# Patient Record
Sex: Female | Born: 1940 | Race: White | Hispanic: No | Marital: Married | Smoking: Never smoker
Health system: Southern US, Community
[De-identification: ages and names within clinical notes are randomized; demographics above are authoritative.]

## PROBLEM LIST (undated history)

## (undated) DIAGNOSIS — E119 Type 2 diabetes mellitus without complications: Secondary | ICD-10-CM

## (undated) DIAGNOSIS — R350 Frequency of micturition: Secondary | ICD-10-CM

## (undated) DIAGNOSIS — I10 Essential (primary) hypertension: Secondary | ICD-10-CM

## (undated) DIAGNOSIS — N952 Postmenopausal atrophic vaginitis: Secondary | ICD-10-CM

## (undated) DIAGNOSIS — Z8719 Personal history of other diseases of the digestive system: Secondary | ICD-10-CM

## (undated) DIAGNOSIS — E7800 Pure hypercholesterolemia, unspecified: Secondary | ICD-10-CM

## (undated) DIAGNOSIS — N393 Stress incontinence (female) (male): Principal | ICD-10-CM

## (undated) DIAGNOSIS — IMO0002 Reserved for concepts with insufficient information to code with codable children: Secondary | ICD-10-CM

## (undated) HISTORY — DX: Essential (primary) hypertension: I10

## (undated) HISTORY — DX: Stress incontinence (female) (male): N39.3

## (undated) HISTORY — DX: Pure hypercholesterolemia, unspecified: E78.00

## (undated) HISTORY — DX: Reserved for concepts with insufficient information to code with codable children: IMO0002

## (undated) HISTORY — DX: Frequency of micturition: R35.0

## (undated) HISTORY — DX: Type 2 diabetes mellitus without complications: E11.9

## (undated) HISTORY — PX: CARPAL TUNNEL RELEASE: SHX101

## (undated) HISTORY — PX: OOPHORECTOMY: SHX86

## (undated) HISTORY — DX: Postmenopausal atrophic vaginitis: N95.2

## (undated) HISTORY — PX: ABDOMINAL HYSTERECTOMY: SHX81

---

## 2006-11-16 ENCOUNTER — Ambulatory Visit: Payer: Self-pay | Admitting: Internal Medicine

## 2007-08-11 ENCOUNTER — Ambulatory Visit: Payer: Self-pay | Admitting: Internal Medicine

## 2007-11-18 ENCOUNTER — Ambulatory Visit: Payer: Self-pay | Admitting: Internal Medicine

## 2007-11-29 ENCOUNTER — Ambulatory Visit: Payer: Self-pay | Admitting: Internal Medicine

## 2008-05-28 ENCOUNTER — Ambulatory Visit: Payer: Self-pay | Admitting: Internal Medicine

## 2008-06-27 ENCOUNTER — Ambulatory Visit: Payer: Self-pay | Admitting: Internal Medicine

## 2008-12-24 ENCOUNTER — Ambulatory Visit: Payer: Self-pay | Admitting: Internal Medicine

## 2009-12-25 ENCOUNTER — Ambulatory Visit: Payer: Self-pay | Admitting: Internal Medicine

## 2010-12-29 ENCOUNTER — Ambulatory Visit: Payer: Self-pay | Admitting: Internal Medicine

## 2011-12-30 ENCOUNTER — Ambulatory Visit: Payer: Self-pay | Admitting: Internal Medicine

## 2012-12-15 ENCOUNTER — Emergency Department: Payer: Self-pay | Admitting: Emergency Medicine

## 2012-12-15 LAB — CBC
HCT: 35.3 % (ref 35.0–47.0)
HGB: 12.1 g/dL (ref 12.0–16.0)
MCH: 31.1 pg (ref 26.0–34.0)
RBC: 3.89 10*6/uL (ref 3.80–5.20)
WBC: 9.9 10*3/uL (ref 3.6–11.0)

## 2012-12-15 LAB — COMPREHENSIVE METABOLIC PANEL
Alkaline Phosphatase: 90 U/L (ref 50–136)
BUN: 30 mg/dL — ABNORMAL HIGH (ref 7–18)
Calcium, Total: 9.4 mg/dL (ref 8.5–10.1)
Chloride: 103 mmol/L (ref 98–107)
Co2: 27 mmol/L (ref 21–32)
EGFR (Non-African Amer.): 50 — ABNORMAL LOW
Glucose: 142 mg/dL — ABNORMAL HIGH (ref 65–99)
Potassium: 3.1 mmol/L — ABNORMAL LOW (ref 3.5–5.1)
SGOT(AST): 28 U/L (ref 15–37)
SGPT (ALT): 30 U/L (ref 12–78)

## 2012-12-15 LAB — URINALYSIS, COMPLETE
Bilirubin,UR: NEGATIVE
Blood: NEGATIVE
Nitrite: NEGATIVE
Protein: NEGATIVE
Specific Gravity: 1.008 (ref 1.003–1.030)
Squamous Epithelial: <1
WBC UR: 2 /HPF (ref 0–5)

## 2012-12-16 LAB — URINE CULTURE

## 2012-12-30 ENCOUNTER — Ambulatory Visit: Payer: Self-pay | Admitting: Internal Medicine

## 2013-10-01 ENCOUNTER — Emergency Department: Payer: Self-pay | Admitting: Emergency Medicine

## 2013-10-01 LAB — URINALYSIS, COMPLETE
Bacteria: NONE SEEN
Blood: NEGATIVE
Hyaline Cast: 7
Ketone: NEGATIVE
Leukocyte Esterase: NEGATIVE
Nitrite: NEGATIVE
Ph: 5 (ref 4.5–8.0)
Protein: NEGATIVE
RBC,UR: <1 /HPF (ref 0–5)
Squamous Epithelial: NONE SEEN

## 2013-10-01 LAB — CBC WITH DIFFERENTIAL/PLATELET
Basophil #: 0.1 10*3/uL (ref 0.0–0.1)
Basophil %: 1.1 %
Eosinophil #: 1.5 10*3/uL — ABNORMAL HIGH (ref 0.0–0.7)
Eosinophil %: 12.3 %
HCT: 33.4 % — ABNORMAL LOW (ref 35.0–47.0)
HGB: 10.7 g/dL — ABNORMAL LOW (ref 12.0–16.0)
MCV: 90 fL (ref 80–100)
Neutrophil #: 6 10*3/uL (ref 1.4–6.5)
Platelet: 464 10*3/uL — ABNORMAL HIGH (ref 150–440)
RBC: 3.73 10*6/uL — ABNORMAL LOW (ref 3.80–5.20)
WBC: 12.1 10*3/uL — ABNORMAL HIGH (ref 3.6–11.0)

## 2013-10-01 LAB — COMPREHENSIVE METABOLIC PANEL
Albumin: 3.3 g/dL — ABNORMAL LOW (ref 3.4–5.0)
Bilirubin,Total: 0.2 mg/dL (ref 0.2–1.0)
Chloride: 107 mmol/L (ref 98–107)
Co2: 26 mmol/L (ref 21–32)
Creatinine: 1.16 mg/dL (ref 0.60–1.30)
EGFR (Non-African Amer.): 48 — ABNORMAL LOW
Glucose: 153 mg/dL — ABNORMAL HIGH (ref 65–99)
Osmolality: 285 (ref 275–301)
Potassium: 3.5 mmol/L (ref 3.5–5.1)
SGOT(AST): 19 U/L (ref 15–37)
SGPT (ALT): 18 U/L (ref 12–78)

## 2013-10-02 LAB — TROPONIN I: Troponin-I: <0.02 ng/mL

## 2014-01-02 ENCOUNTER — Ambulatory Visit: Payer: Self-pay | Admitting: Family Medicine

## 2014-02-13 ENCOUNTER — Ambulatory Visit: Payer: Self-pay | Admitting: Gastroenterology

## 2014-03-06 ENCOUNTER — Ambulatory Visit: Payer: Self-pay | Admitting: Gastroenterology

## 2015-01-04 ENCOUNTER — Ambulatory Visit: Payer: Self-pay | Admitting: Family Medicine

## 2015-12-13 ENCOUNTER — Other Ambulatory Visit: Payer: Self-pay | Admitting: Family Medicine

## 2015-12-13 DIAGNOSIS — Z1231 Encounter for screening mammogram for malignant neoplasm of breast: Secondary | ICD-10-CM

## 2016-01-06 ENCOUNTER — Other Ambulatory Visit: Payer: Self-pay | Admitting: Family Medicine

## 2016-01-06 ENCOUNTER — Ambulatory Visit
Admission: RE | Admit: 2016-01-06 | Discharge: 2016-01-06 | Disposition: A | Discharge status: Home / Self Care | Payer: Medicare Other | Source: Ambulatory Visit | Attending: Family Medicine | Admitting: Family Medicine

## 2016-01-06 DIAGNOSIS — Z1231 Encounter for screening mammogram for malignant neoplasm of breast: Secondary | ICD-10-CM

## 2016-04-16 ENCOUNTER — Encounter: Payer: Self-pay | Admitting: Obstetrics and Gynecology

## 2016-04-16 ENCOUNTER — Ambulatory Visit (INDEPENDENT_AMBULATORY_CARE_PROVIDER_SITE_OTHER): Payer: Medicare Other | Admitting: Obstetrics and Gynecology

## 2016-04-16 VITALS — BP 118/78 | HR 83 | Ht 65.0 in | Wt 156.6 lb

## 2016-04-16 DIAGNOSIS — N3289 Other specified disorders of bladder: Secondary | ICD-10-CM | POA: Diagnosis not present

## 2016-04-16 DIAGNOSIS — N8110 Cystocele, unspecified: Secondary | ICD-10-CM

## 2016-04-16 DIAGNOSIS — N952 Postmenopausal atrophic vaginitis: Secondary | ICD-10-CM

## 2016-04-16 DIAGNOSIS — N393 Stress incontinence (female) (male): Secondary | ICD-10-CM

## 2016-04-16 DIAGNOSIS — R350 Frequency of micturition: Secondary | ICD-10-CM | POA: Diagnosis not present

## 2016-04-16 DIAGNOSIS — IMO0002 Reserved for concepts with insufficient information to code with codable children: Secondary | ICD-10-CM | POA: Insufficient documentation

## 2016-04-16 HISTORY — DX: Reserved for concepts with insufficient information to code with codable children: IMO0002

## 2016-04-16 HISTORY — DX: Postmenopausal atrophic vaginitis: N95.2

## 2016-04-16 HISTORY — DX: Frequency of micturition: R35.0

## 2016-04-16 HISTORY — DX: Stress incontinence (female) (male): N39.3

## 2016-04-16 LAB — POCT URINALYSIS DIPSTICK
BILIRUBIN UA: NEGATIVE
Glucose, UA: NEGATIVE
Ketones, UA: NEGATIVE
NITRITE UA: NEGATIVE
PH UA: 6
PROTEIN UA: NEGATIVE
RBC UA: NEGATIVE
Spec Grav, UA: 1.01
Urobilinogen, UA: 0.2

## 2016-04-16 MED ORDER — ESTRADIOL 0.1 MG/GM VA CREA: 0.5000 | TOPICAL_CREAM | VAGINAL | Status: DC

## 2016-04-16 NOTE — Progress Notes (Signed)
GYN ENCOUNTER NOTE  Subjective:       Kara Sullivan is a 75 y.o. [id number] female is here for gynecologic evaluation of the following issues:  1. Bladder dysfunction- s/p hysterectomy >30years ago; 3 years ago she had a trial of several types of pessaries for her bladder drop, urgency, leakage, and frequency without success; must wear a pad at all times; has leakage with valsalva; states that her bladder has descended further over the past 3 years and wishes to have it addressed; denies constipation and nocturia    Gynecologic History No LMP recorded. Patient has had a hysterectomy.  Obstetric History OB History  Gravida Para Term Preterm AB SAB TAB Ectopic Multiple Living  2 2 2       2     # Outcome Date GA Lbr Len/2nd Weight Sex Delivery Anes PTL Lv  2 Term 1970   7 lb 3.2 oz (3.266 kg) F Vag-Spont   Y  1 Term 1965   8 lb 1.9 oz (3.683 kg) F Vag-Spont   Y      Past Medical History  Diagnosis Date  . Hypertension   . High cholesterol   . Diabetes mellitus without complication [facility](HCC)     Past Surgical History  Procedure Laterality Date  . Abdominal hysterectomy    . Oophorectomy    . Carpal tunnel release      No current outpatient prescriptions on file prior to visit.   No current facility-administered medications on file prior to visit.    Allergies  Allergen Reactions  . Ciprofloxacin Swelling  . Rosuvastatin Other (See Comments)    Elevated liver enzymes  . Sulfa Antibiotics Nausea And Vomiting    Social History   Social History  . Marital Status: Married    Spouse Name: N/A  . Number of Children: N/A  . Years of Education: N/A   Occupational History  . Not on file.   Social History Main Topics  . Smoking status: Never Smoker   . Smokeless tobacco: Not on file  . Alcohol Use: No  . Drug Use: No  . Sexual Activity: No   Other Topics Concern  . Not on file   Social History Narrative    Family History  Problem Relation Age of Onset  . Breast  cancer Maternal Aunt 768  . Heart attack Father   . Diabetes Neg Hx   . Colon cancer Neg Hx   . Ovarian cancer Neg Hx     The following portions of the patient's history were reviewed and updated as appropriate: allergies, current medications, past family history, past medical history, past social history, past surgical history and problem list.  Review of Systems Review of Systems - General ROS: negative for - chills, fatigue, fever, hot flashes, malaise or night sweats Hematological and Lymphatic ROS: negative for - bleeding problems or swollen lymph nodes Gastrointestinal ROS: negative for - abdominal pain, blood in stools, change in bowel habits and nausea/vomiting, negative for constipation Musculoskeletal ROS: negative for - joint pain, muscle pain or muscular weakness Genito-Urinary ROS: Positive for- urgency, increased frequency; negative for - change in menstrual cycle, dysmenorrhea, dyspareunia, dysuria, genital discharge, genital ulcers, hematuria, irregular/heavy menses, nocturia; incomplete emptying or pelvic pain  Objective:   BP 118/78 mmHg  Pulse 83  Ht 5\' 5"  (1.651 m)  Wt 156 lb 9.6 oz (71.033 kg)  BMI 26.06 kg/m2 CONSTITUTIONAL: Well-developed, well-nourished female in no acute distress.  HENT:  Normocephalic, atraumatic.  NECK: Normal  range of motion, supple, no masses.  Normal thyroid.  SKIN: Skin is warm and dry. No rash noted. Not diaphoretic. No erythema. No pallor. NEUROLGIC: Alert and oriented to person, place, and time. PSYCHIATRIC: Normal mood and affect. Normal behavior. Normal judgment and thought content. CARDIOVASCULAR:Not Examined RESPIRATORY: Not Examined BREASTS: Not Examined ABDOMEN: Soft, non distended; Non tender.  No Organomegaly. PELVIC:  External Genitalia: Normal  BUS: Normal  Vagina: Mild atrophy; second third degree cystocele with Valsalva loss of the urethrovesical junction angle; mild rectocele  Cervix: Surgically absent  Uterus:  Surgically absent  Adnexa: Normal, nontender, nonpalpable  RV: Normal sphincter tone, mild 1st degree rectocele  Bladder: Nontender, 3rd degree cystocele present MUSCULOSKELETAL: Normal range of motion. No tenderness.  No cyanosis, clubbing, or edema.     Assessment:   1. Frequent urination - POCT urinalysis dipstick - Urine culture 2. Cystocele 3. Mild rectocele not requiring repair  4. Stress urinary incontinence 5. Unstable bladder   Plan:   1. Anterior colporrhaphy With Kelly plication in 6 weeks 2. Return to clinic in 5 weeks for pre-op appointment 3. Patient understands unstable bladder symptoms may require medication for management 4. Patient understands that stress incontinence may be suboptimally treated in the future definitive surgery with sling may be warranted. 5. Estrace cream intravaginal twice a week until surgery  Mable FillPaige Owczarczak, PA-S Herold HarmsMartin A Amorina Doerr, MD   I have seen, interviewed, and examined the patient in conjunction with the [employer] and affirm the diagnosis and management plan. Arihanna Estabrook A. Ashlyn Cabler, MD, FACOG   Note: This dictation was prepared with Dragon dictation along with smaller phrase technology. Any transcriptional errors that result from this process are unintentional.

## 2016-04-16 NOTE — Patient Instructions (Signed)
1. Estrace cream 1 g intravaginal twice a week 2. Surgery for bladder prolapse is scheduled in 6 weeks 3. Return in 5 weeks for preop appointment

## 2016-04-17 LAB — URINE CULTURE

## 2016-04-30 ENCOUNTER — Telehealth: Payer: Self-pay | Admitting: Obstetrics and Gynecology

## 2016-04-30 MED ORDER — ESTROGENS, CONJUGATED 0.625 MG/GM VA CREA: 0.5000 | TOPICAL_CREAM | VAGINAL | Status: DC

## 2016-04-30 NOTE — Telephone Encounter (Signed)
Pt is having a bladder surgery, she wants to know her restrictions because she is going to move.

## 2016-04-30 NOTE — Telephone Encounter (Signed)
Pt may have to move surgery date d/t her moving. Advised pt when she [facility] calls to set up surgery date she may change it at that time. Pts estrace cream was over 200.00 dollars. Sent in premarin. Pt will call me back if too expensive. May have to give another sample.

## 2016-06-02 ENCOUNTER — Telehealth: Payer: Self-pay | Admitting: Obstetrics and Gynecology

## 2016-06-02 NOTE — Telephone Encounter (Signed)
Pt aware samples left up front for p/u.  

## 2016-06-02 NOTE — Telephone Encounter (Signed)
Pt said you told her you could give her some samples of ESTRACE. Can she come pick up sample?

## 2016-07-31 ENCOUNTER — Telehealth: Payer: Self-pay | Admitting: Obstetrics and Gynecology

## 2016-07-31 NOTE — Telephone Encounter (Signed)
Pt states she is living with family at this time. She will call back to schedule surgery once she is in her home. Aware to continue to use estrace cream twice weekly.

## 2016-07-31 NOTE — Telephone Encounter (Signed)
Pt called and she said she had a miss call from here and sh sees dr Tommi Rumpsde and wasn't sure if it was you that had called her or not.

## 2016-10-12 HISTORY — PX: BLADDER SUSPENSION: SHX72

## 2016-11-20 ENCOUNTER — Other Ambulatory Visit: Payer: Self-pay | Admitting: Family Medicine

## 2016-11-20 DIAGNOSIS — Z1231 Encounter for screening mammogram for malignant neoplasm of breast: Secondary | ICD-10-CM

## 2017-01-06 ENCOUNTER — Ambulatory Visit
Admission: RE | Admit: 2017-01-06 | Discharge: 2017-01-06 | Disposition: A | Discharge status: Home / Self Care | Payer: Medicare Other | Source: Ambulatory Visit | Attending: Family Medicine | Admitting: Family Medicine

## 2017-01-06 DIAGNOSIS — Z1231 Encounter for screening mammogram for malignant neoplasm of breast: Secondary | ICD-10-CM | POA: Diagnosis not present

## 2017-02-26 ENCOUNTER — Telehealth: Payer: Self-pay | Admitting: Certified Nurse Midwife

## 2017-02-26 MED ORDER — ESTROGENS, CONJUGATED 0.625 MG/GM VA CREA: 0.5000 | TOPICAL_CREAM | VAGINAL | 12 refills | Status: DC

## 2017-02-26 NOTE — Telephone Encounter (Signed)
Patient Kara Sullivan stating (Crystal Called patient to pick up "a cream" the patient needs). Please advise.

## 2017-02-26 NOTE — Telephone Encounter (Signed)
Pt has not been seen since 04/2016. Advised rx for premarin will be resent to [facility]. Pt will check with pharmacy first.

## 2017-02-27 ENCOUNTER — Other Ambulatory Visit: Payer: Self-pay | Admitting: Obstetrics and Gynecology

## 2017-03-01 ENCOUNTER — Telehealth: Payer: Self-pay | Admitting: Obstetrics and Gynecology

## 2017-03-01 NOTE — Telephone Encounter (Signed)
Pt aware premarin is 135.00 for 7 months of use (0.5 gm twice weekly).There is no generic.

## 2017-03-01 NOTE — Telephone Encounter (Signed)
Patient spoke with phamacy and the Premarin cream is 145.00 to fill  They told her she can get a generic for 45.00 - can that be changed with the pharmacy and do we have any samples available  Please call

## 2017-03-15 ENCOUNTER — Telehealth: Payer: Self-pay | Admitting: Obstetrics and Gynecology

## 2017-03-15 NOTE — Telephone Encounter (Signed)
Please call concerning medication - cream

## 2017-03-15 NOTE — Telephone Encounter (Signed)
Pt states that she has been on premarin for 1 week and has developed joint pain. She questioned if it was a s/e of premarin. Advised her to stop premarin for 2 weeks. Monitor sx. F/u at appt on 6/19.

## 2017-03-30 ENCOUNTER — Encounter: Payer: Self-pay | Admitting: Obstetrics and Gynecology

## 2017-03-30 ENCOUNTER — Ambulatory Visit (INDEPENDENT_AMBULATORY_CARE_PROVIDER_SITE_OTHER): Payer: Medicare Other | Admitting: Obstetrics and Gynecology

## 2017-03-30 VITALS — BP 155/67 | HR 88 | Ht 65.0 in | Wt 165.0 lb

## 2017-03-30 DIAGNOSIS — Z01818 Encounter for other preprocedural examination: Secondary | ICD-10-CM

## 2017-03-30 DIAGNOSIS — N393 Stress incontinence (female) (male): Secondary | ICD-10-CM

## 2017-03-30 DIAGNOSIS — N8111 Cystocele, midline: Secondary | ICD-10-CM | POA: Insufficient documentation

## 2017-03-30 DIAGNOSIS — N3289 Other specified disorders of bladder: Secondary | ICD-10-CM

## 2017-03-30 NOTE — H&P (Signed)
Subjective: PREOPERATIVE HISTORY AND PHYSICAL      Date of surgery: 04/19/2017 Diagnosis: 1. 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 Stress urinary incontinence Procedure: Anterior colporrhaphy with Kelly plication   Kara Sullivan is a 76 y.o. [id number] scheduled for anterior colporrhaphy with Kelly plication for symptomatic second to third-degree cystocele. 1. Bladder dysfunction- s/p hysterectomy >30years ago; 3 years ago she had a trial of several types of pessaries for her bladder drop, urgency, leakage, and frequency without success; must wear a pad at all times; has leakage with valsalva; states that her bladder has descended further over the past 3 years and wishes to have it addressed; denies constipation and nocturia .   Gynecologic History No LMP recorded. Kara Sullivan has had a hysterectomy.    OB History    Gravida Para Term Preterm AB Living   2 2 2     2   SAB TAB Ectopic Multiple Live Births           2     No LMP recorded. Kara Sullivan has had a hysterectomy.    Past Medical History:  Diagnosis Date  . Diabetes mellitus without complication (HCC)   . High cholesterol   . Hypertension     Past Surgical History:  Procedure Laterality Date  . ABDOMINAL HYSTERECTOMY    . CARPAL TUNNEL RELEASE    . OOPHORECTOMY      OB History  Gravida Para Term Preterm AB Living  2 2 2     2  SAB TAB Ectopic Multiple Live Births          2    # Outcome Date GA Lbr Len/2nd Weight Sex Delivery Anes PTL Lv  2 Term 1970   7 lb 3.2 oz (3.266 kg) F Vag-Spont   LIV  1 Term 1965   8 lb 1.9 oz (3.683 kg) F Vag-Spont   LIV      Social History   Social History  . Marital status: Married    Spouse name: N/A  . Number of children: N/A  . Years of education: N/A   Social History Main Topics  . Smoking status: Never Smoker  . Smokeless tobacco: Never Used  . Alcohol use No  . Drug use: No  . Sexual activity: No   Other Topics Concern  . None   Social History Narrative  . None     Family History  Problem Relation Age of Onset  . Breast cancer Maternal Aunt 68  . Heart attack Father   . Diabetes Neg Hx   . Colon cancer Neg Hx   . Ovarian cancer Neg Hx      (Not in a hospital admission)  Allergies  Allergen Reactions  . Atorvastatin Other (See Comments)  . Ciprofloxacin Swelling  . Rosuvastatin Other (See Comments)    Elevated liver enzymes  . Sulfa Antibiotics Nausea And Vomiting    Review of Systems Constitutional: No recent fever/chills/sweats Respiratory: No recent cough/bronchitis Cardiovascular: No chest pain Gastrointestinal: No recent nausea/vomiting/diarrhea Genitourinary: No UTI symptoms Hematologic/lymphatic:No history of coagulopathy or recent blood thinner use    Objective:    BP (!) 155/67   Pulse 88   Ht 5' 5" (1.651 m)   Wt 165 lb (74.8 kg)   BMI 27.46 kg/m   General:   Normal  Skin:   normal  HEENT:  Normal  Neck:  Supple without Adenopathy or Thyromegaly  Lungs:   Heart:                Breasts:   Abdomen:  Pelvis:  M/S   Extremeties:  Neuro:    clear to auscultation bilaterally   Normal without murmur   Not Examined   soft, non-tender; bowel sounds normal; no masses,  no organomegaly    No CVAT  Warm/Dry   Normal       03/30/2017 pelvic exam:  External genitalia-normal  BUS-urethral caruncle  Vagina-third-degree cystocele; no significant rectocele,: Possible enterocele  Cervix-surgically absent  Uterus-surgically absent  Rectovaginal-deferred to the OR  Assessment:     1. 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 Stress urinary incontinence   Plan:  Anterior colporrhaphy with Kelly plication  Preop counseling: The Kara Sullivan is to undergo anterior colporrhaphy with Kelly plication on 04/19/2017 for symptomatic second to third-degree cystocele. She is understanding of the planned procedure and is aware of and accepting of all surgical risks which include but are not limited to bleeding,  infection, pelvic organ injury with need for repair, blood clot disorders, anesthesia risks, etc. All questions have been answered. Informed consent is given. Kara Sullivan is ready and willing to proceed with surgery as scheduled.  Taiki Buckwalter A Brittnae Aschenbrenner, MD  Note: This dictation was prepared with Dragon dictation along with smaller phrase technology. Any transcriptional errors that result from this process are unintentional.    

## 2017-03-30 NOTE — Progress Notes (Signed)
Subjective: PREOPERATIVE HISTORY AND PHYSICAL      Date of surgery: 04/19/2017 Diagnosis: 1. 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 Stress urinary incontinence Procedure: Anterior colporrhaphy with Kelly plication   Patient is a 76 y.o. [id number] scheduled for anterior colporrhaphy with Kelly plication for symptomatic second to third-degree cystocele. 1. Bladder dysfunction- s/p hysterectomy >30years ago; 3 years ago she had a trial of several types of pessaries for her bladder drop, urgency, leakage, and frequency without success; must wear a pad at all times; has leakage with valsalva; states that her bladder has descended further over the past 3 years and wishes to have it addressed; denies constipation and nocturia .   Gynecologic History No LMP recorded. Patient has had a hysterectomy.    OB History    Gravida Para Term Preterm AB Living   2 2 2     2    SAB TAB Ectopic Multiple Live Births           2     No LMP recorded. Patient has had a hysterectomy.    Past Medical History:  Diagnosis Date  . Diabetes mellitus without complication (HCC)   . High cholesterol   . Hypertension     Past Surgical History:  Procedure Laterality Date  . ABDOMINAL HYSTERECTOMY    . CARPAL TUNNEL RELEASE    . OOPHORECTOMY      OB History  Gravida Para Term Preterm AB Living  2 2 2     2   SAB TAB Ectopic Multiple Live Births          2    # Outcome Date GA Lbr Len/2nd Weight Sex Delivery Anes PTL Lv  2 Term 1970   7 lb 3.2 oz (3.266 kg) F Vag-Spont   LIV  1 Term 1965   8 lb 1.9 oz (3.683 kg) F Vag-Spont   LIV      Social History   Social History  . Marital status: Married    Spouse name: N/A  . Number of children: N/A  . Years of education: N/A   Social History Main Topics  . Smoking status: Never Smoker  . Smokeless tobacco: Never Used  . Alcohol use No  . Drug use: No  . Sexual activity: No   Other Topics Concern  . None   Social History Narrative  . None     Family History  Problem Relation Age of Onset  . Breast cancer Maternal Aunt [id number]  . Heart attack Father   . Diabetes Neg Hx   . Colon cancer Neg Hx   . Ovarian cancer Neg Hx      (Not in a hospital admission)  Allergies  Allergen Reactions  . Atorvastatin Other (See Comments)  . Ciprofloxacin Swelling  . Rosuvastatin Other (See Comments)    Elevated liver enzymes  . Sulfa Antibiotics Nausea And Vomiting    Review of Systems Constitutional: No recent fever/chills/sweats Respiratory: No recent cough/bronchitis Cardiovascular: No chest pain Gastrointestinal: No recent nausea/vomiting/diarrhea Genitourinary: No UTI symptoms Hematologic/lymphatic:No history of coagulopathy or recent blood thinner use    Objective:    BP (!) 155/67   Pulse 88   Ht 5\' 5"  (1.651 m)   Wt 165 lb (74.8 kg)   BMI 27.46 kg/m   General:   Normal  Skin:   normal  HEENT:  Normal  Neck:  Supple without Adenopathy or Thyromegaly  Lungs:   Heart:  Breasts:   Abdomen:  Pelvis:  M/S   Extremeties:  Neuro:    clear to auscultation bilaterally   Normal without murmur   Not Examined   soft, non-tender; bowel sounds normal; no masses,  no organomegaly    No CVAT  Warm/Dry   Normal       03/30/2017 pelvic exam:  External genitalia-normal  BUS-urethral caruncle  Vagina-third-degree cystocele; no significant rectocele,: Possible enterocele  Cervix-surgically absent  Uterus-surgically absent  Rectovaginal-deferred to the OR  Assessment:     1. 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 Stress urinary incontinence   Plan:  Anterior colporrhaphy with Kelly plication  Preop counseling: The patient is to undergo anterior colporrhaphy with Kelly plication on 04/19/2017 for symptomatic second to third-degree cystocele. She is understanding of the planned procedure and is aware of and accepting of all surgical risks which include but are not limited to bleeding,  infection, pelvic organ injury with need for repair, blood clot disorders, anesthesia risks, etc. All questions have been answered. Informed consent is given. Patient is ready and willing to proceed with surgery as scheduled.  Herold Harms, MD  Note: This dictation was prepared with Dragon dictation along with smaller phrase technology. Any transcriptional errors that result from this process are unintentional.

## 2017-03-30 NOTE — Patient Instructions (Signed)
1.  Return for postop check a week after surgery 

## 2017-04-08 ENCOUNTER — Encounter
Admission: RE | Admit: 2017-04-08 | Discharge: 2017-04-08 | Disposition: A | Discharge status: Home / Self Care | Payer: Medicare Other | Source: Ambulatory Visit | Attending: Obstetrics and Gynecology | Admitting: Obstetrics and Gynecology

## 2017-04-08 DIAGNOSIS — E119 Type 2 diabetes mellitus without complications: Secondary | ICD-10-CM | POA: Insufficient documentation

## 2017-04-08 DIAGNOSIS — R9431 Abnormal electrocardiogram [ECG] [EKG]: Secondary | ICD-10-CM | POA: Diagnosis not present

## 2017-04-08 DIAGNOSIS — Z01812 Encounter for preprocedural laboratory examination: Secondary | ICD-10-CM | POA: Diagnosis present

## 2017-04-08 DIAGNOSIS — E7800 Pure hypercholesterolemia, unspecified: Secondary | ICD-10-CM | POA: Diagnosis not present

## 2017-04-08 DIAGNOSIS — Z01810 Encounter for preprocedural cardiovascular examination: Secondary | ICD-10-CM | POA: Diagnosis present

## 2017-04-08 DIAGNOSIS — I10 Essential (primary) hypertension: Secondary | ICD-10-CM | POA: Insufficient documentation

## 2017-04-08 LAB — TYPE AND SCREEN
ABO/RH(D): A POS
Antibody Screen: NEGATIVE

## 2017-04-08 LAB — RAPID HIV SCREEN (HIV 1/2 AB+AG)
HIV 1/2 Antibodies: NONREACTIVE
HIV-1 P24 Antigen - HIV24: NONREACTIVE

## 2017-04-08 NOTE — Patient Instructions (Signed)
Your procedure is scheduled on: Monday April 19, 2017 Report to Same Day Surgery on the 2nd floor in the [facility]. To find out your arrival time, please call [phone] between 1PM - 3PM on: Friday April 16, 2017   REMEMBER: Instructions that are not followed completely may result in serious medical risk up to and including death; or upon the discretion of your surgeon and anesthesiologist your surgery may need to be rescheduled.  Do not eat food or drink liquids after midnight. No gum chewing or hard candies  No Alcohol for 24 hours before or after surgery.  No Smoking for 24 hours prior to surgery.  Notify your doctor if there is any change in your medical condition (cold, fever, infection).  Do not wear jewelry, make-up, hairpins, clips or nail polish.  Do not wear lotions, powders, or perfumes.   Do not shave 48 hours prior to surgery. Men may shave face and neck.  Contacts and dentures may not be worn into surgery.  Do not bring valuables to the hospital. [facility] belongings or valuables.   TAKE THESE MEDICATIONS THE MORNING OF SURGERY WITH A SIP OF WATER:  NONE    Use CHG Soap or wipes as directed on instruction sheet.  Stop Metformin and Janumet 2 days prior to surgery. NONE ON July 7 AND 8.  Follow recommendations from Cardiologist, Pulmonologist or PCP regarding stopping Aspirin, Coumadin, Plavix, Eliquis, Pradaxa, or Pletal. STOP ASPIRIN April 12, 2017  Stop Anti-inflammatories such as Advil, Aleve, Ibuprofen, Motrin, Naproxen, Naprosyn, Goodie powder, or aspirin products. (May take Tylenol or Acetaminophen and Celebrex if needed.)  Stop supplements until after surgery. FISH OIL  (May continue Vitamin D, Vitamin B, and multivitamin.)  If you are being admitted to the hospital overnight, leave your suitcase in the car. After surgery it may be brought to your room.  If you are being discharged the day of surgery, you will not be  allowed to drive home. You will need someone to drive you home and stay with you that night.

## 2017-04-09 LAB — RPR: RPR Ser Ql: NONREACTIVE

## 2017-04-12 ENCOUNTER — Other Ambulatory Visit: Payer: Medicare Other

## 2017-04-19 ENCOUNTER — Encounter
Admission: RE | Disposition: A | Discharge status: Home / Self Care | Payer: Self-pay | Source: Ambulatory Visit | Attending: Obstetrics and Gynecology

## 2017-04-19 ENCOUNTER — Ambulatory Visit: Payer: Medicare Other | Admitting: Certified Registered Nurse Anesthetist

## 2017-04-19 ENCOUNTER — Observation Stay
Admission: RE | Admit: 2017-04-19 | Discharge: 2017-04-20 | Disposition: A | Discharge status: Home / Self Care | Payer: Medicare Other | Source: Ambulatory Visit | Attending: Obstetrics and Gynecology | Admitting: Obstetrics and Gynecology

## 2017-04-19 DIAGNOSIS — Z79899 Other long term (current) drug therapy: Secondary | ICD-10-CM | POA: Insufficient documentation

## 2017-04-19 DIAGNOSIS — E119 Type 2 diabetes mellitus without complications: Secondary | ICD-10-CM | POA: Insufficient documentation

## 2017-04-19 DIAGNOSIS — Z791 Long term (current) use of non-steroidal anti-inflammatories (NSAID): Secondary | ICD-10-CM | POA: Diagnosis not present

## 2017-04-19 DIAGNOSIS — Z9079 Acquired absence of other genital organ(s): Secondary | ICD-10-CM | POA: Insufficient documentation

## 2017-04-19 DIAGNOSIS — N3946 Mixed incontinence: Secondary | ICD-10-CM | POA: Diagnosis not present

## 2017-04-19 DIAGNOSIS — E7800 Pure hypercholesterolemia, unspecified: Secondary | ICD-10-CM | POA: Insufficient documentation

## 2017-04-19 DIAGNOSIS — Z7984 Long term (current) use of oral hypoglycemic drugs: Secondary | ICD-10-CM | POA: Insufficient documentation

## 2017-04-19 DIAGNOSIS — Z7982 Long term (current) use of aspirin: Secondary | ICD-10-CM | POA: Diagnosis not present

## 2017-04-19 DIAGNOSIS — N3289 Other specified disorders of bladder: Secondary | ICD-10-CM | POA: Insufficient documentation

## 2017-04-19 DIAGNOSIS — D649 Anemia, unspecified: Secondary | ICD-10-CM | POA: Diagnosis not present

## 2017-04-19 DIAGNOSIS — I10 Essential (primary) hypertension: Secondary | ICD-10-CM | POA: Insufficient documentation

## 2017-04-19 DIAGNOSIS — N815 Vaginal enterocele: Secondary | ICD-10-CM | POA: Insufficient documentation

## 2017-04-19 DIAGNOSIS — Z90710 Acquired absence of both cervix and uterus: Secondary | ICD-10-CM | POA: Insufficient documentation

## 2017-04-19 DIAGNOSIS — N8110 Cystocele, unspecified: Principal | ICD-10-CM | POA: Insufficient documentation

## 2017-04-19 DIAGNOSIS — N8111 Cystocele, midline: Secondary | ICD-10-CM | POA: Diagnosis present

## 2017-04-19 HISTORY — PX: CYSTOCELE REPAIR: SHX163

## 2017-04-19 LAB — POCT I-STAT 4, (NA,K, GLUC, HGB,HCT)
GLUCOSE: 140 mg/dL — AB (ref 65–99)
HCT: 34 % — ABNORMAL LOW (ref 36.0–46.0)
Hemoglobin: 11.6 g/dL — ABNORMAL LOW (ref 12.0–15.0)
POTASSIUM: 4.1 mmol/L (ref 3.5–5.1)
SODIUM: 140 mmol/L (ref 135–145)

## 2017-04-19 LAB — ABO/RH: ABO/RH(D): A POS

## 2017-04-19 LAB — GLUCOSE, CAPILLARY
GLUCOSE-CAPILLARY: 143 mg/dL — AB (ref 65–99)
Glucose-Capillary: 188 mg/dL — ABNORMAL HIGH (ref 65–99)

## 2017-04-19 SURGERY — COLPORRHAPHY, ANTERIOR, FOR CYSTOCELE REPAIR
Anesthesia: General | Site: Vagina | Wound class: Clean Contaminated

## 2017-04-19 MED ORDER — MORPHINE SULFATE (PF) 2 MG/ML IV SOLN
1.0000 mg | INTRAVENOUS | Status: DC | PRN
Start: 2017-04-19 — End: 2017-04-20
  Administered 2017-04-19 (×2): 2 mg via INTRAVENOUS
  Filled 2017-04-19 (×2): qty 1

## 2017-04-19 MED ORDER — KETOROLAC TROMETHAMINE 30 MG/ML IJ SOLN
15.0000 mg | Freq: Four times a day (QID) | INTRAMUSCULAR | Status: DC
  Administered 2017-04-19 – 2017-04-20 (×5): 15 mg via INTRAVENOUS
  Filled 2017-04-19: qty 1

## 2017-04-19 MED ORDER — PROPOFOL 10 MG/ML IV BOLUS
INTRAVENOUS | Status: AC
  Filled 2017-04-19: qty 20

## 2017-04-19 MED ORDER — SODIUM CHLORIDE 0.9 % IV SOLN
INTRAVENOUS | Status: DC
  Administered 2017-04-19: 06:00:00 via INTRAVENOUS

## 2017-04-19 MED ORDER — LIDOCAINE HCL (CARDIAC) 20 MG/ML IV SOLN
INTRAVENOUS | Status: DC | PRN
  Administered 2017-04-19: 60 mg via INTRAVENOUS

## 2017-04-19 MED ORDER — FENTANYL CITRATE (PF) 250 MCG/5ML IJ SOLN
INTRAMUSCULAR | Status: AC
  Filled 2017-04-19: qty 5

## 2017-04-19 MED ORDER — PHENYLEPHRINE HCL 10 MG/ML IJ SOLN
INTRAMUSCULAR | Status: DC | PRN
  Administered 2017-04-19: 100 ug via INTRAVENOUS

## 2017-04-19 MED ORDER — SEVOFLURANE IN SOLN
RESPIRATORY_TRACT | Status: AC
  Filled 2017-04-19: qty 250

## 2017-04-19 MED ORDER — ONDANSETRON HCL 4 MG/2ML IJ SOLN
INTRAMUSCULAR | Status: AC
  Filled 2017-04-19: qty 2

## 2017-04-19 MED ORDER — OXYCODONE-ACETAMINOPHEN 5-325 MG PO TABS
1.0000 | ORAL_TABLET | ORAL | Status: DC | PRN
  Administered 2017-04-19: 1 via ORAL
  Filled 2017-04-19: qty 1

## 2017-04-19 MED ORDER — FAMOTIDINE 20 MG PO TABS
20.0000 mg | ORAL_TABLET | Freq: Once | ORAL | Status: AC
  Administered 2017-04-19: 20 mg via ORAL

## 2017-04-19 MED ORDER — SUGAMMADEX SODIUM 200 MG/2ML IV SOLN
INTRAVENOUS | Status: AC
  Filled 2017-04-19: qty 2

## 2017-04-19 MED ORDER — PROPOFOL 10 MG/ML IV BOLUS
INTRAVENOUS | Status: DC | PRN
  Administered 2017-04-19: 120 mg via INTRAVENOUS
  Administered 2017-04-19: 30 mg via INTRAVENOUS
  Administered 2017-04-19: 20 mg via INTRAVENOUS

## 2017-04-19 MED ORDER — PHENYLEPHRINE HCL 10 MG/ML IJ SOLN
INTRAMUSCULAR | Status: AC
  Filled 2017-04-19: qty 1

## 2017-04-19 MED ORDER — DEXAMETHASONE SODIUM PHOSPHATE 10 MG/ML IJ SOLN
INTRAMUSCULAR | Status: DC | PRN
  Administered 2017-04-19: 5 mg via INTRAVENOUS

## 2017-04-19 MED ORDER — ONDANSETRON HCL 4 MG/2ML IJ SOLN
INTRAMUSCULAR | Status: DC | PRN
  Administered 2017-04-19: 4 mg via INTRAVENOUS

## 2017-04-19 MED ORDER — FENTANYL CITRATE (PF) 100 MCG/2ML IJ SOLN
INTRAMUSCULAR | Status: AC
Start: 2017-04-19 — End: 2017-04-19
  Administered 2017-04-19: 25 ug via INTRAVENOUS
  Filled 2017-04-19: qty 2

## 2017-04-19 MED ORDER — ESTROGENS, CONJUGATED 0.625 MG/GM VA CREA
TOPICAL_CREAM | VAGINAL | Status: DC | PRN
  Administered 2017-04-19: 1 via VAGINAL

## 2017-04-19 MED ORDER — KETOROLAC TROMETHAMINE 30 MG/ML IJ SOLN
15.0000 mg | Freq: Four times a day (QID) | INTRAMUSCULAR | Status: DC
  Filled 2017-04-19 (×4): qty 1

## 2017-04-19 MED ORDER — SUGAMMADEX SODIUM 200 MG/2ML IV SOLN
INTRAVENOUS | Status: DC | PRN
  Administered 2017-04-19: 200 mg via INTRAVENOUS

## 2017-04-19 MED ORDER — LIDOCAINE HCL (PF) 2 % IJ SOLN
INTRAMUSCULAR | Status: AC
  Filled 2017-04-19: qty 2

## 2017-04-19 MED ORDER — METFORMIN HCL ER 500 MG PO TB24
500.0000 mg | ORAL_TABLET | Freq: Every day | ORAL | Status: DC
  Administered 2017-04-19: 500 mg via ORAL
  Filled 2017-04-19 (×2): qty 1

## 2017-04-19 MED ORDER — DEXAMETHASONE SODIUM PHOSPHATE 10 MG/ML IJ SOLN
INTRAMUSCULAR | Status: AC
  Filled 2017-04-19: qty 1

## 2017-04-19 MED ORDER — ROCURONIUM BROMIDE 100 MG/10ML IV SOLN
INTRAVENOUS | Status: DC | PRN
  Administered 2017-04-19: 50 mg via INTRAVENOUS

## 2017-04-19 MED ORDER — LACTATED RINGERS IV SOLN
INTRAVENOUS | Status: DC
  Administered 2017-04-19 – 2017-04-20 (×3): via INTRAVENOUS

## 2017-04-19 MED ORDER — LACTATED RINGERS IV SOLN: INTRAVENOUS | Status: DC

## 2017-04-19 MED ORDER — FENTANYL CITRATE (PF) 100 MCG/2ML IJ SOLN
25.0000 ug | INTRAMUSCULAR | Status: DC | PRN
  Administered 2017-04-19 (×6): 25 ug via INTRAVENOUS

## 2017-04-19 MED ORDER — ONDANSETRON HCL 4 MG/2ML IJ SOLN: 4.0000 mg | Freq: Once | INTRAMUSCULAR | Status: DC | PRN

## 2017-04-19 MED ORDER — EPHEDRINE SULFATE 50 MG/ML IJ SOLN
INTRAMUSCULAR | Status: AC
Start: 2017-04-19 — End: 2017-04-19
  Filled 2017-04-19: qty 1

## 2017-04-19 MED ORDER — EPHEDRINE SULFATE 50 MG/ML IJ SOLN
INTRAMUSCULAR | Status: DC | PRN
  Administered 2017-04-19: 10 mg via INTRAVENOUS

## 2017-04-19 MED ORDER — ROCURONIUM BROMIDE 50 MG/5ML IV SOLN
INTRAVENOUS | Status: AC
  Filled 2017-04-19: qty 1

## 2017-04-19 MED ORDER — DOCUSATE SODIUM 100 MG PO CAPS
100.0000 mg | ORAL_CAPSULE | Freq: Two times a day (BID) | ORAL | Status: DC
  Administered 2017-04-20: 100 mg via ORAL
  Filled 2017-04-19: qty 1

## 2017-04-19 MED ORDER — FENTANYL CITRATE (PF) 100 MCG/2ML IJ SOLN
INTRAMUSCULAR | Status: DC | PRN
  Administered 2017-04-19: 25 ug via INTRAVENOUS
  Administered 2017-04-19 (×2): 50 ug via INTRAVENOUS
  Administered 2017-04-19 (×2): 25 ug via INTRAVENOUS

## 2017-04-19 MED ORDER — ESTROGENS, CONJUGATED 0.625 MG/GM VA CREA
TOPICAL_CREAM | VAGINAL | Status: AC
  Filled 2017-04-19: qty 30

## 2017-04-19 MED ORDER — LISINOPRIL 10 MG PO TABS
20.0000 mg | ORAL_TABLET | Freq: Every day | ORAL | Status: DC
  Administered 2017-04-20: 20 mg via ORAL
  Filled 2017-04-19: qty 2

## 2017-04-19 MED ORDER — HYDROCHLOROTHIAZIDE 12.5 MG PO CAPS
12.5000 mg | ORAL_CAPSULE | Freq: Every day | ORAL | Status: DC
  Administered 2017-04-20: 12.5 mg via ORAL
  Filled 2017-04-19 (×2): qty 1

## 2017-04-19 MED ORDER — FENTANYL CITRATE (PF) 100 MCG/2ML IJ SOLN
INTRAMUSCULAR | Status: AC
  Filled 2017-04-19: qty 2

## 2017-04-19 MED ORDER — FAMOTIDINE 20 MG PO TABS
ORAL_TABLET | ORAL | Status: AC
  Administered 2017-04-19: 20 mg via ORAL
  Filled 2017-04-19: qty 1

## 2017-04-19 MED ORDER — SIMETHICONE 80 MG PO CHEW: 80.0000 mg | CHEWABLE_TABLET | Freq: Four times a day (QID) | ORAL | Status: DC | PRN

## 2017-04-19 MED ORDER — ACETAMINOPHEN 325 MG PO TABS: 650.0000 mg | ORAL_TABLET | ORAL | Status: DC | PRN

## 2017-04-19 MED ORDER — LISINOPRIL-HYDROCHLOROTHIAZIDE 20-12.5 MG PO TABS: 1.0000 | ORAL_TABLET | Freq: Every day | ORAL | Status: DC

## 2017-04-19 SURGICAL SUPPLY — 27 items
BAG URO DRAIN 2000ML W/SPOUT (MISCELLANEOUS) ×2 IMPLANT
CANISTER SUCT 1200ML W/VALVE (MISCELLANEOUS) ×2 IMPLANT
CATH FOLEY 2WAY  5CC 16FR (CATHETERS) ×1
CATH URTH 16FR FL 2W BLN LF (CATHETERS) ×1 IMPLANT
DRAPE PERI LITHO V/GYN (MISCELLANEOUS) ×2 IMPLANT
DRAPE SHEET LG 3/4 BI-LAMINATE (DRAPES) ×2 IMPLANT
DRAPE UNDER BUTTOCK W/FLU (DRAPES) ×2 IMPLANT
ELECT REM PT RETURN 9FT ADLT (ELECTROSURGICAL) ×2
ELECTRODE REM PT RTRN 9FT ADLT (ELECTROSURGICAL) ×1 IMPLANT
GAUZE PACK 2X3YD (MISCELLANEOUS) ×2 IMPLANT
GLOVE BIO SURGEON STRL SZ8 (GLOVE) ×8 IMPLANT
GLOVE INDICATOR 8.0 STRL GRN (GLOVE) ×8 IMPLANT
GOWN STRL REUS W/ TWL LRG LVL3 (GOWN DISPOSABLE) ×2 IMPLANT
GOWN STRL REUS W/ TWL XL LVL3 (GOWN DISPOSABLE) ×2 IMPLANT
GOWN STRL REUS W/TWL LRG LVL3 (GOWN DISPOSABLE) ×2
GOWN STRL REUS W/TWL XL LVL3 (GOWN DISPOSABLE) ×2
KIT RM TURNOVER CYSTO AR (KITS) ×2 IMPLANT
LABEL OR SOLS (LABEL) ×2 IMPLANT
NS IRRIG 500ML POUR BTL (IV SOLUTION) IMPLANT
PACK BASIN MINOR ARMC (MISCELLANEOUS) ×2 IMPLANT
PAD PREP 24X41 OB/GYN DISP (PERSONAL CARE ITEMS) ×2 IMPLANT
SPONGE XRAY 4X4 16PLY STRL (MISCELLANEOUS) ×2 IMPLANT
SUT CHROMIC 2 0 CT 1 (SUTURE) ×10 IMPLANT
SUT VIC AB 0 CT1 36 (SUTURE) ×4 IMPLANT
SUT VIC AB 0 CT2 27 (SUTURE) ×6 IMPLANT
SUT VIC AB 2-0 UR6 27 (SUTURE) ×8 IMPLANT
SYRINGE 10CC LL (SYRINGE) ×2 IMPLANT

## 2017-04-19 NOTE — Anesthesia Procedure Notes (Signed)
Procedure Name: Intubation Date/Time: 04/19/2017 7:45 AM Performed by: Eben Burow Pre-anesthesia Checklist: Patient identified, Emergency Drugs available, Suction available, Timeout performed and Patient being monitored Patient Re-evaluated:Patient Re-evaluated prior to inductionOxygen Delivery Method: Circle system utilized Preoxygenation: Pre-oxygenation with 100% oxygen Intubation Type: IV induction Ventilation: Mask ventilation without difficulty and Oral airway inserted - appropriate to patient size Laryngoscope Size: Mac and 3 Grade View: Grade I Tube type: Oral Tube size: 7.0 mm Number of attempts: 1 Airway Equipment and Method: Stylet Placement Confirmation: ETT inserted through vocal cords under direct vision,  positive ETCO2 and breath sounds checked- equal and bilateral Secured at: 21 cm Tube secured with: Tape Dental Injury: Teeth and Oropharynx as per pre-operative assessment

## 2017-04-19 NOTE — Anesthesia Post-op Follow-up Note (Cosign Needed)
Anesthesia QCDR form completed.        

## 2017-04-19 NOTE — Op Note (Signed)
OPERATIVE NOTE:  Richelle ItoFrancis E Gist PROCEDURE DATE: 04/19/2017 with   PREOPERATIVE DIAGNOSIS:  1. 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 Urinary incontinence 3. Unstable bladder  POSTOPERATIVE DIAGNOSIS:  1. 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 Urinary incontinence 3. Unstable bladder 4. Vaginal enterocele  PROCEDURE:  1. Anterior colporrhaphy with Kelly plication 2. Enterocele ligation   SURGEON:  Herold HarmsMartin A Farin Buhman, MD ASSISTANTS: Surgical tech ANESTHESIA: General INDICATIONS: 76 y.o. [id number] with second third degree cystocele, symptomatic, with urinary incontinence, urination, pelvic pressure, and mixed incontinence symptoms. Symptoms are progressively worsening and she now desires surgical repair.  FINDINGS:  Second to third-degree cystocele with rotational descent of the bladder neck; vaginal enterocele   I/O's: Total I/O In: -  Out: 400 [Urine:300; Blood:100] COUNTS:  YES SPECIMENS: None ANTIBIOTIC PROPHYLAXIS:N/A COMPLICATIONS: None immediate  PROCEDURE IN DETAIL: Patient was brought to the operating room and placed in supine position. General endotracheal anesthesia was induced without difficulty. She was placed in the dorsal lithotomy position using the candycane stirrups. A ChloraPrep perineal intravaginal prep and drape was performed in standard fashion. Timeout was performed. Foley catheter was placed in the bladder and was draining clear yellow urine. Weighted speculum was placed in the vagina. Allis clamps were used to grasp the lateral margins of the vaginal mucosa at the base of the bladder. Transverse incision was made through the vaginal mucosa. Using Metzenbaum scissors, the vaginal mucosa was undermined and incised in the midline. Allis Adair retractors were used to help facilitate exposure. This procedure was carried out to within 2 cm of the urethral meatus. The perivesical fascia was dissected off of the vagina through sharp and  blunt dissection. Once adequately mobilized, an enterocele was identified. The enterocele was not opened. 2 pursestring sutures of 0 Vicryl were placed in order to reduce the enterocele posteriorly. 2-0 Vicryl suture was then used in a vertical mattress technique to place Kelly plication sutures, followed by reduction of the cystocele with vertical mattress sutures of 2-0 Vicryl. Once the cystocele was adequately reduced, the vaginal mucosa was trimmed, and then reapproximated using 2-0 chromic simple interrupted sutures. Vaginal packing was placed using Kerlix and Premarin cream. The patient was then awakened mobilized and taken to recovery room in satisfactory condition. The urine was noted to be clear at the end of the procedure.  Larine Fielding A. Beatris Sie Francesco, MD, ACOG [facility]

## 2017-04-19 NOTE — H&P (View-Only) (Signed)
Subjective: PREOPERATIVE HISTORY AND PHYSICAL      Date of surgery: 04/19/2017 Diagnosis: 1. 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 Stress urinary incontinence Procedure: Anterior colporrhaphy with Kelly plication   Patient is a 76 y.o. G2P2002female scheduled for anterior colporrhaphy with Kelly plication for symptomatic second to third-degree cystocele. 1. Bladder dysfunction- s/p hysterectomy >30years ago; 3 years ago she had a trial of several types of pessaries for her bladder drop, urgency, leakage, and frequency without success; must wear a pad at all times; has leakage with valsalva; states that her bladder has descended further over the past 3 years and wishes to have it addressed; denies constipation and nocturia .   Gynecologic History No LMP recorded. Patient has had a hysterectomy.    OB History    Gravida Para Term Preterm AB Living   2 2 2     2   SAB TAB Ectopic Multiple Live Births           2     No LMP recorded. Patient has had a hysterectomy.    Past Medical History:  Diagnosis Date  . Diabetes mellitus without complication (HCC)   . High cholesterol   . Hypertension     Past Surgical History:  Procedure Laterality Date  . ABDOMINAL HYSTERECTOMY    . CARPAL TUNNEL RELEASE    . OOPHORECTOMY      OB History  Gravida Para Term Preterm AB Living  2 2 2     2  SAB TAB Ectopic Multiple Live Births          2    # Outcome Date GA Lbr Len/2nd Weight Sex Delivery Anes PTL Lv  2 Term 1970   7 lb 3.2 oz (3.266 kg) F Vag-Spont   LIV  1 Term 1965   8 lb 1.9 oz (3.683 kg) F Vag-Spont   LIV      Social History   Social History  . Marital status: Married    Spouse name: N/A  . Number of children: N/A  . Years of education: N/A   Social History Main Topics  . Smoking status: Never Smoker  . Smokeless tobacco: Never Used  . Alcohol use No  . Drug use: No  . Sexual activity: No   Other Topics Concern  . None   Social History Narrative  . None     Family History  Problem Relation Age of Onset  . Breast cancer Maternal Aunt 68  . Heart attack Father   . Diabetes Neg Hx   . Colon cancer Neg Hx   . Ovarian cancer Neg Hx      (Not in a hospital admission)  Allergies  Allergen Reactions  . Atorvastatin Other (See Comments)  . Ciprofloxacin Swelling  . Rosuvastatin Other (See Comments)    Elevated liver enzymes  . Sulfa Antibiotics Nausea And Vomiting    Review of Systems Constitutional: No recent fever/chills/sweats Respiratory: No recent cough/bronchitis Cardiovascular: No chest pain Gastrointestinal: No recent nausea/vomiting/diarrhea Genitourinary: No UTI symptoms Hematologic/lymphatic:No history of coagulopathy or recent blood thinner use    Objective:    BP (!) 155/67   Pulse 88   Ht 5' 5" (1.651 m)   Wt 165 lb (74.8 kg)   BMI 27.46 kg/m   General:   Normal  Skin:   normal  HEENT:  Normal  Neck:  Supple without Adenopathy or Thyromegaly  Lungs:   Heart:                Breasts:   Abdomen:  Pelvis:  M/S   Extremeties:  Neuro:    clear to auscultation bilaterally   Normal without murmur   Not Examined   soft, non-tender; bowel sounds normal; no masses,  no organomegaly    No CVAT  Warm/Dry   Normal       03/30/2017 pelvic exam:  External genitalia-normal  BUS-urethral caruncle  Vagina-third-degree cystocele; no significant rectocele,: Possible enterocele  Cervix-surgically absent  Uterus-surgically absent  Rectovaginal-deferred to the OR  Assessment:     1. 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 Stress urinary incontinence   Plan:  Anterior colporrhaphy with Kelly plication  Preop counseling: The patient is to undergo anterior colporrhaphy with Kelly plication on 04/19/2017 for symptomatic second to third-degree cystocele. She is understanding of the planned procedure and is aware of and accepting of all surgical risks which include but are not limited to bleeding,  infection, pelvic organ injury with need for repair, blood clot disorders, anesthesia risks, etc. All questions have been answered. Informed consent is given. Patient is ready and willing to proceed with surgery as scheduled.  Shaheem Pichon A Florrie Ramires, MD  Note: This dictation was prepared with Dragon dictation along with smaller phrase technology. Any transcriptional errors that result from this process are unintentional.    

## 2017-04-19 NOTE — Interval H&P Note (Signed)
History and Physical Interval Note:  04/19/2017 7:25 AM  Kara Sullivan  has presented today for surgery, with the diagnosis of FREQUENT URINATION, CYSTOCELE, UNSTABLE BLADDER, VAGINAL ATROPHY, STRESS INCONTINENCE  The various methods of treatment have been discussed with the patient and family. After consideration of risks, benefits and other options for treatment, the patient has consented to  Procedure(s): ANTERIOR REPAIR (CYSTOCELE) (N/A) as a surgical intervention .  The patient's history has been reviewed, patient examined, no change in status, stable for surgery.  I have reviewed the patient's chart and labs.  Questions were answered to the patient's satisfaction.     Daphine DeutscherMartin A Altheria Shadoan

## 2017-04-19 NOTE — Anesthesia Preprocedure Evaluation (Signed)
Anesthesia Evaluation  Patient identified by MRN, date of birth, ID band Patient awake    Reviewed: Allergy & Precautions, NPO status , Patient's Chart, lab work & pertinent test results  History of Anesthesia Complications Negative for: history of anesthetic complications  Airway Mallampati: II       Dental   Pulmonary neg pulmonary ROS,           Cardiovascular hypertension, Pt. on medications      Neuro/Psych negative neurological ROS     GI/Hepatic negative GI ROS, Neg liver ROS,   Endo/Other  diabetes, Type 2, Oral Hypoglycemic Agents  Renal/GU      Musculoskeletal   Abdominal   Peds  Hematology negative hematology ROS (+)   Anesthesia Other Findings   Reproductive/Obstetrics                             Anesthesia Physical Anesthesia Plan  ASA: II  Anesthesia Plan: General   Post-op Pain Management:    Induction: Intravenous  PONV Risk Score and Plan: 4 or greater and Ondansetron, Dexamethasone, Propofol, Midazolam and Scopolamine patch - Pre-op  Airway Management Planned: Oral ETT  Additional Equipment:   Intra-op Plan:   Post-operative Plan:   Informed Consent: I have reviewed the patients History and Physical, chart, labs and discussed the procedure including the risks, benefits and alternatives for the proposed anesthesia with the patient or authorized representative who has indicated his/her understanding and acceptance.     Plan Discussed with:   Anesthesia Plan Comments:         Anesthesia Quick Evaluation

## 2017-04-19 NOTE — Anesthesia Postprocedure Evaluation (Signed)
Anesthesia Post Note  Patient: Kara Sullivan  Procedure(s) Performed: Procedure(s) (LRB): ANTERIOR COLPORRHAPY WITH KELLY PLICATION AND ENTEROCELE LIGATION (N/A)  Patient location during evaluation: PACU Anesthesia Type: General Level of consciousness: awake and alert Pain management: pain level controlled Vital Signs Assessment: post-procedure vital signs reviewed and stable Respiratory status: spontaneous breathing and respiratory function stable Cardiovascular status: stable Anesthetic complications: no     Last Vitals:  Vitals:   04/19/17 1005 04/19/17 1010  BP:    Pulse: 89 93  Resp: 18 19  Temp:      Last Pain:  Vitals:   04/19/17 1010  TempSrc:   PainSc: 6                  Anberlin Diez K

## 2017-04-19 NOTE — Transfer of Care (Signed)
Immediate Anesthesia Transfer of Care Note  Patient: Kara Sullivan  Procedure(s) Performed: Procedure(s): ANTERIOR COLPORRHAPY WITH KELLY PLICATION AND ENTEROCELE LIGATION (N/A)  Patient Location: PACU  Anesthesia Type:General  Level of Consciousness: drowsy  Airway & Oxygen Therapy: Patient Spontanous Breathing and Patient connected to face mask oxygen  Post-op Assessment: Report given to RN and Post -op Vital signs reviewed and stable  Post vital signs: Reviewed and stable  Last Vitals:  Vitals:   04/19/17 0600 04/19/17 0942  BP: (!) 167/72 (!) 125/54  Pulse: 84 91  Resp: 16 16  Temp: 36.6 C 36.7 C    Last Pain:  Vitals:   04/19/17 0942  TempSrc:   PainSc: Asleep         Complications: No apparent anesthesia complications

## 2017-04-20 DIAGNOSIS — N8110 Cystocele, unspecified: Secondary | ICD-10-CM | POA: Diagnosis not present

## 2017-04-20 LAB — HEMOGLOBIN: Hemoglobin: 9.4 g/dL — ABNORMAL LOW (ref 12.0–16.0)

## 2017-04-20 MED ORDER — DOCUSATE SODIUM 100 MG PO CAPS: 100.0000 mg | ORAL_CAPSULE | Freq: Two times a day (BID) | ORAL | 0 refills | Status: DC

## 2017-04-20 MED ORDER — OXYCODONE-ACETAMINOPHEN 5-325 MG PO TABS: 1.0000 | ORAL_TABLET | ORAL | 0 refills | Status: DC | PRN

## 2017-04-20 MED ORDER — FERROUS SULFATE 325 (65 FE) MG PO TABS: 325.0000 mg | ORAL_TABLET | Freq: Every day | ORAL | 0 refills | Status: DC

## 2017-04-20 NOTE — Progress Notes (Signed)
D/C instructions provided, pt states understanding, aware of follow up appt.  Prescription given to pt.   

## 2017-04-20 NOTE — Discharge Summary (Signed)
Physician Discharge Summary  Patient ID: Kara Sullivan MRN: [id number] DOB/AGE: 74/05/1941 74 y.o.  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 Miod Post Op anemia, asymptomatic   Significant Diagnostic Studies:  Lab Results  Component Value Date   HGB 9.4 (L) 04/20/2017   HGB 11.6 (L) 04/19/2017   HGB 10.7 (L) 10/01/2013   Lab Results  Component Value Date   HCT 34.0 (L) 04/19/2017   HCT 33.4 (L) 10/01/2013   HCT 35.3 12/15/2012   CBC Latest Ref Rng & Units 04/20/2017 04/19/2017 10/01/2013  WBC 3.6 - 11.0 x10 3/mm 3 - - 12.1(H)  Hemoglobin 12.0 - 16.0 g/dL 4.0(J9.4(L) 11.6(L) 10.7(L)  Hematocrit 36.0 - 46.0 % - 34.0(L) 33.4(L)  Platelets 150 - 440 x10 3/mm 3 - - 464(H)     Discharged Condition: good  Discharge Exam: Blood pressure 138/66, pulse 82, temperature 98.3 F (36.8 C), temperature source Oral, resp. rate 16, weight 165 lb (74.8 kg), SpO2 95 %. Incision/Wound: no drainage  Disposition: Final discharge disposition-home   Allergies as of 04/20/2017      Reactions   Atorvastatin Other (See Comments)   Ciprofloxacin Swelling   Rosuvastatin Other (See Comments)   Elevated liver enzymes   Sulfa Antibiotics Nausea And Vomiting      Medication List    STOP taking these medications   PREMARIN vaginal cream Generic drug:  conjugated estrogens     TAKE these medications   acetaminophen 500 MG tablet Commonly known as:  TYLENOL Take 1,000 mg by mouth every 8 (eight) hours as needed for mild pain.   aspirin EC 81 MG tablet Take 81 mg by mouth daily.   calcium-vitamin D 250-125 MG-UNIT tablet Commonly known as:  OSCAL WITH D Take 1 tablet by mouth daily.    docusate sodium 100 MG capsule Commonly known as:  COLACE Take 1 capsule (100 mg total) by mouth 2 (two) times daily.   ferrous sulfate 325 (65 FE) MG tablet Take 1 tablet (325 mg total) by mouth daily with breakfast.   Fish Oil 1000 MG Caps Take 1,000 mg by mouth daily.   lisinopril-hydrochlorothiazide 20-12.5 MG tablet Commonly known as:  PRINZIDE,ZESTORETIC Take 1 tablet by mouth daily.   meloxicam 15 MG tablet Commonly known as:  MOBIC Take 15 mg by mouth daily as needed for pain.   metFORMIN 500 MG 24 hr tablet Commonly known as:  GLUCOPHAGE-XR Take 500 mg by mouth daily with supper.   multivitamin with minerals Tabs tablet Take 1 tablet by mouth daily. For Women   oxyCODONE-acetaminophen 5-325 MG tablet Commonly known as:  PERCOCET/ROXICET Take 1-2 tablets by mouth every 4 (four) hours as needed (moderate to severe pain (when tolerating fluids)).   triamcinolone cream 0.1 % Commonly known as:  KENALOG Apply 1 application topically daily as needed (rash).   ZETIA 10 MG tablet Generic drug:  ezetimibe Take 10 mg by mouth daily.      Follow-up Information    Bryssa Tones, Prentice DockerMartin A, MD Follow up.   Specialties:  Obstetrics and Gynecology, Radiology Contact information: [address] [place] [address] [phone]           Signed: Prentice DockerMartin A Kara Sullivan 04/20/2017, 8:02 AM

## 2017-04-20 NOTE — Care Management Obs Status (Signed)
MEDICARE OBSERVATION STATUS NOTIFICATION   Patient Details  Name: Kara Sullivan MRN: [id number] Date of Birth: 10/12/1941   Medicare Observation Status Notification Given:  Yes    Gwenette GreetBrenda S Prather Failla, RN 04/20/2017, 10:58 AM

## 2017-04-27 ENCOUNTER — Encounter: Payer: Self-pay | Admitting: Obstetrics and Gynecology

## 2017-04-27 ENCOUNTER — Ambulatory Visit (INDEPENDENT_AMBULATORY_CARE_PROVIDER_SITE_OTHER): Payer: Medicare Other | Admitting: Obstetrics and Gynecology

## 2017-04-27 VITALS — BP 156/66 | HR 84 | Ht 65.0 in | Wt 160.2 lb

## 2017-04-27 DIAGNOSIS — N3289 Other specified disorders of bladder: Secondary | ICD-10-CM

## 2017-04-27 DIAGNOSIS — Z09 Encounter for follow-up examination after completed treatment for conditions other than malignant neoplasm: Secondary | ICD-10-CM

## 2017-04-27 DIAGNOSIS — N8111 Cystocele, midline: Secondary | ICD-10-CM

## 2017-04-27 DIAGNOSIS — N393 Stress incontinence (female) (male): Secondary | ICD-10-CM

## 2017-04-27 MED ORDER — MIRABEGRON ER 25 MG PO TB24: 25.0000 mg | ORAL_TABLET | Freq: Every day | ORAL | 3 refills | Status: DC

## 2017-04-27 NOTE — Progress Notes (Signed)
Chief complaint: 1. One week postop check 2. Status post anterior colporrhaphy with enterocele ligation 3. History of mixed incontinence  Patient presents for surgical  postop check approximately 8 days after surgery. She reports normal bowel function and bladder function. She is experiencing some possible mild incontinence having to change a pad about twice a day. She notices some urgency symptoms when standing. She is having nocturia twice a night. She has not been on any medication for unstable bladder in the past.  Past medical history, past surgical history, problem list, medications, and allergies are reviewed  OBJECTIVE: BP (!) 156/66   Pulse 84   Ht 5\' 5"  (1.651 m)   Wt 160 lb 3.2 oz (72.7 kg)   BMI 26.66 kg/m  Physical exam-deferred  ASSESSMENT: 1. Mild urge symptoms, noted with standing postoperatively. 2. Status post anterior colporrhaphy with enterocele ligation  PLAN: 1. Trial of Merbetriq 25 mg at night 2. Continue with Colace 100 mg twice a day along with increase water intake 3. Return in 5 weeks for final postop check  Herold HarmsMartin A Defrancesco, MD  Note: This dictation was prepared with Dragon dictation along with smaller phrase technology. Any transcriptional errors that result from this process are unintentional.

## 2017-04-27 NOTE — Patient Instructions (Addendum)
1. Start Merbetriq 25 mg at night 2. Continue taking Colace 100 mg twice a day as a stool softener 3. Continue with drinking water at least 5 or 6 glasses per day 4. Return in 5 weeks for final postop check

## 2017-05-25 ENCOUNTER — Telehealth: Payer: Self-pay | Admitting: Obstetrics and Gynecology

## 2017-05-25 NOTE — Telephone Encounter (Signed)
Pt is still leaking. Using mybetriq daily. Using 2 pads. Doing Kegels. Worse when she is up on her feet.  NO uti sx. Pt advised to monitor for now. She will f/u at post op next week.

## 2017-05-25 NOTE — Telephone Encounter (Signed)
Patient has a question concerning leakage since she had surgery - this has not lighted up, she is wearing 2 pads together - its really bad when she is walking.   Please call

## 2017-06-01 ENCOUNTER — Encounter: Payer: Self-pay | Admitting: Obstetrics and Gynecology

## 2017-06-01 ENCOUNTER — Ambulatory Visit (INDEPENDENT_AMBULATORY_CARE_PROVIDER_SITE_OTHER): Payer: Medicare Other | Admitting: Obstetrics and Gynecology

## 2017-06-01 VITALS — BP 160/69 | HR 81 | Ht 65.0 in | Wt 160.8 lb

## 2017-06-01 DIAGNOSIS — N39498 Other specified urinary incontinence: Secondary | ICD-10-CM | POA: Diagnosis not present

## 2017-06-01 DIAGNOSIS — Z09 Encounter for follow-up examination after completed treatment for conditions other than malignant neoplasm: Secondary | ICD-10-CM

## 2017-06-01 DIAGNOSIS — N393 Stress incontinence (female) (male): Secondary | ICD-10-CM

## 2017-06-01 LAB — POCT URINALYSIS DIPSTICK
Bilirubin, UA: NEGATIVE
Blood, UA: NEGATIVE
GLUCOSE UA: NEGATIVE
KETONES UA: NEGATIVE
LEUKOCYTES UA: NEGATIVE
Nitrite, UA: NEGATIVE
PROTEIN UA: NEGATIVE
SPEC GRAV UA: 1.01 (ref 1.010–1.025)
Urobilinogen, UA: 0.2 E.U./dL
pH, UA: 6.5 (ref 5.0–8.0)

## 2017-06-01 NOTE — Progress Notes (Signed)
Chief complaint: 1. Postop check 2. Status post anterior/posterior colporrhaphy with enterocele ligation  Patient presents for 6 week postop check status post anterior/posterior colporrhaphy with enterocele ligation for symptomatic pelvic organ prolapse. She Had a history of mixed urinary incontinence; She has been started on Merbetriq in the past month and has had improvement in her urgency symptoms. She is only going to the bathroom twice a night and does not have urge symptomatology. HOWEVER, the patient has had significant onset of stress incontinence and spontane since her surgery. The patient has had to wear depends as well as many pads in order to control pill control her urinary leakage The patient is having difficulty performing routine activities ofy living because of the Incontinence.  Patient does not report any significant vaginal Bleeding. She does not report any significant vaginal discharge. She does not report any significant pelvic pressure.  Past medical history, past surgical history, problem list, medications, and allergies are reviewed  OBJECTIVE: BP (!) 160/69   Pulse 81   Ht 5\' 5"  (1.651 m)   Wt 160 lb 12.8 oz (72.9 kg)   BMI 26.76 kg/m   Pleasant, well-appearing, elderly female in no acute distAbdomen: Soft, nontender. Pelvic exam: External genitalia-normal BUS-normal. Vagina-surgical incision sites are well-healed.  The vagina demonstrates a grade 1 cystocele; there is good apex vaginal support; there is no rectocele.  The vagina has a depth of approximately 4 cm.  With Valsalva, there is minimal rotational descent of the bladder neck Bimanual-no palpable masses or tenderness  ASSESSMENT: 1.  Status post pelvic organ prolapse surgery for Second to third-degree cystocele, rectocele and enterocele. 2.  Mixed incontinence; urge component is improved with Wellbutrin therapy; stress component is exacerbated post surgery.  PLAN: 1.  Recommend urology referral for  consideration of pubovaginal sling.  Herold Harms, MD  Note: This dictation was prepared with Dragon dictation along with smaller phrase technology. Any transcriptional errors that result from this process are unintentional.

## 2017-06-02 NOTE — Patient Instructions (Signed)
1.  Resume activities as tolerated. 2.  Referral to urology for pubovaginal sling. 3.  Return in 3 months for follow-up

## 2017-06-03 LAB — URINE CULTURE: Organism ID, Bacteria: NO GROWTH

## 2017-06-29 ENCOUNTER — Ambulatory Visit (INDEPENDENT_AMBULATORY_CARE_PROVIDER_SITE_OTHER): Payer: Medicare Other | Admitting: Urology

## 2017-06-29 ENCOUNTER — Encounter: Payer: Self-pay | Admitting: Urology

## 2017-06-29 VITALS — BP 154/71 | HR 94 | Ht 64.0 in | Wt 160.0 lb

## 2017-06-29 DIAGNOSIS — N3946 Mixed incontinence: Secondary | ICD-10-CM | POA: Diagnosis not present

## 2017-06-29 DIAGNOSIS — N393 Stress incontinence (female) (male): Secondary | ICD-10-CM | POA: Diagnosis not present

## 2017-06-29 LAB — URINALYSIS, COMPLETE
BILIRUBIN UA: NEGATIVE
GLUCOSE, UA: NEGATIVE
KETONES UA: NEGATIVE
Leukocytes, UA: NEGATIVE
Nitrite, UA: NEGATIVE
Protein, UA: NEGATIVE
Specific Gravity, UA: 1.02 (ref 1.005–1.030)
Urobilinogen, Ur: 0.2 mg/dL (ref 0.2–1.0)
pH, UA: 6 (ref 5.0–7.5)

## 2017-06-29 LAB — BLADDER SCAN AMB NON-IMAGING

## 2017-06-29 NOTE — Progress Notes (Signed)
06/29/2017 9:57 AM   Kara Sullivan 1940-10-22 [id number]  Referring provider: Kandyce Rud, MD [phone] [address] [facility] [place], [place] [address]  Chief Complaint  Patient presents with  . Urinary Incontinence    New Patient    HPI: Chart review: The patient is approximately 10 weeks since an anterior and posterior repair and repair of enterocele. It appears the patient started having stress incontinence after surgery. The patient had been given the beta 3 agonist. What I reviewed the medical records she may have just had a repair of enterocele and no rectocele.  Prior to surgery by history she had mild stress and urge incontinence. She was wearing one to 2 liners a day that were mildly wet or were present for safety. She had no bedwetting  Currently she leaks with coughing sneezing bending and standing. She can leak without awareness even with sitting. She does believe it is urethral leaking. She can have urgency incontinence but she is not having a lot of her urgency. She has little to no bedwetting. The beta 3 agonists was started after surgery and she was not treated with medication prior. She is wearing at least 8 pads a day that can be soaked and she utilizes a double pad system  She is voiding every 2 or 3 hours and hourly during the day and she cannot hold urination for 2 hours.  She denies a history of previous GU surgery otherwise and she does not get bladder infections. She has had a prior hysterectomy. Bowel movements are normal. She has not had a kidney stone  Modifying factors: There are no other modifying factors  Associated signs and symptoms: There are no other associated signs and symptoms Aggravating and relieving factors: There are no other aggravating or relieving factors Severity: Moderate Duration: Persistent      PMH: Past Medical History:  Diagnosis Date  . 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 Diabetes mellitus without  complication (HCC)   . Frequent urination 04/16/2016  . High cholesterol   . Hypertension   . Stress incontinence 04/16/2016  . Vaginal atrophy 04/16/2016    Surgical History: Past Surgical History:  Procedure Laterality Date  . ABDOMINAL HYSTERECTOMY    . BLADDER SUSPENSION  2018  . CARPAL TUNNEL RELEASE Right   . CYSTOCELE REPAIR N/A 04/19/2017   Procedure: ANTERIOR COLPORRHAPY WITH KELLY PLICATION AND ENTEROCELE LIGATION;  Surgeon: Herold Harms, MD;  Location: [facility] ORS;  Service: Gynecology;  Laterality: N/A;  . OOPHORECTOMY      Home Medications:  Allergies as of 06/29/2017      Reactions   Atorvastatin Other (See Comments)   Ciprofloxacin Swelling   Rosuvastatin Other (See Comments)   Elevated liver enzymes   Sulfa Antibiotics Nausea And Vomiting      Medication List       Accurate as of 06/29/17  9:57 AM. Always use your most recent med list.          acetaminophen 500 MG tablet Commonly known as:  TYLENOL Take 1,000 mg by mouth every 8 (eight) hours as needed for mild pain.   aspirin EC 81 MG tablet Take 81 mg by mouth daily.   calcium-vitamin D 250-125 MG-UNIT tablet Commonly known as:  OSCAL WITH D Take 1 tablet by mouth daily.   ferrous sulfate 325 (65 FE) MG tablet Take 1 tablet (325 mg total) by mouth daily with breakfast.   Fish Oil 1000 MG Caps Take  1,000 mg by mouth daily.   lisinopril-hydrochlorothiazide 20-12.5 MG tablet Commonly known as:  PRINZIDE,ZESTORETIC Take 1 tablet by mouth daily.   meloxicam 15 MG tablet Commonly known as:  MOBIC Take 15 mg by mouth daily as needed for pain.   metFORMIN 500 MG 24 hr tablet Commonly known as:  GLUCOPHAGE-XR Take 500 mg by mouth daily with supper.   mirabegron ER 25 MG Tb24 tablet Commonly known as:  MYRBETRIQ Take 1 tablet (25 mg total) by mouth daily.   multivitamin with minerals Tabs tablet Take 1 tablet by mouth daily. For Women   triamcinolone cream 0.1 % Commonly known as:   KENALOG Apply 1 application topically daily as needed (rash).   ZETIA 10 MG tablet Generic drug:  ezetimibe Take 10 mg by mouth daily.            Discharge Care Instructions        Start     Ordered   06/29/17 0000  Urinalysis, Complete     06/29/17 0912   06/29/17 0000  BLADDER SCAN AMB NON-IMAGING     06/29/17 0912      Allergies:  Allergies  Allergen Reactions  . Atorvastatin Other (See Comments)  . Ciprofloxacin Swelling  . Rosuvastatin Other (See Comments)    Elevated liver enzymes  . Sulfa Antibiotics Nausea And Vomiting    Family History: Family History  Problem Relation Age of Onset  . Breast cancer Maternal Aunt 38  . Heart attack Father   . Diabetes Neg Hx   . Colon cancer Neg Hx   . Ovarian cancer Neg Hx     Social History:  reports that she has never smoked. She has never used smokeless tobacco. She reports that she does not drink alcohol or use drugs.  ROS: UROLOGY Frequent Urination?: Yes Hard to postpone urination?: Yes Burning/pain with urination?: No Get up at night to urinate?: Yes Leakage of urine?: Yes Urine stream starts and stops?: Yes Trouble starting stream?: No Do you have to strain to urinate?: No Blood in urine?: No Urinary tract infection?: No Sexually transmitted disease?: No Injury to kidneys or bladder?: No Painful intercourse?: No Weak stream?: No Currently pregnant?: No Vaginal bleeding?: No Last menstrual period?: hysterectomy  Gastrointestinal Nausea?: No Vomiting?: No Indigestion/heartburn?: No Diarrhea?: No Constipation?: No  Constitutional Fever: No Night sweats?: No Weight loss?: No Fatigue?: Yes  Skin Skin rash/lesions?: Yes Itching?: No  Eyes Blurred vision?: No Double vision?: No  Ears/Nose/Throat Sore throat?: No Sinus problems?: No  Hematologic/Lymphatic Swollen glands?: No Easy bruising?: No  Cardiovascular Leg swelling?: No Chest pain?: No  Respiratory Cough?:  No Shortness of breath?: No  Endocrine Excessive thirst?: No  Musculoskeletal Back pain?: Yes Joint pain?: No  Neurological Headaches?: No Dizziness?: No  Psychologic Depression?: No Anxiety?: No  Physical Exam: BP (!) 154/71   Pulse 94   Ht  (1.626 m)   Wt 160 lb (72.6 kg)   BMI 27.46 kg/m   Constitutional:  Alert and oriented, No acute distress. HEENT: [place] AT, moist mucus membranes.  Trachea midline, no masses. Cardiovascular: No clubbing, cyanosis, or edema. Respiratory: Normal respiratory effort, no increased work of breathing. GI: Abdomen is soft, nontender, nondistended, no abdominal masses GU: Mild grade 2 hypermobility of the bladder neck and a moderate positive cough test. Small grade 1 cystocele. No rectocele. Vaginal length a proximally 7 cm Skin: No rashes, bruises or suspicious lesions. Lymph: No cervical or inguinal adenopathy. Neurologic: Grossly intact, no  focal deficits, moving all 4 extremities. Psychiatric: Normal mood and affect.  Laboratory Data: Lab Results  Component Value Date   WBC 12.1 (H) 10/01/2013   HGB 9.4 (L) 04/20/2017   HCT 34.0 (L) 04/19/2017   MCV 90 10/01/2013   PLT 464 (H) 10/01/2013    Lab Results  Component Value Date   CREATININE 1.16 10/01/2013    Urinalysis    Component Value Date/Time   COLORURINE Straw 10/01/2013 2057   APPEARANCEUR Clear 10/01/2013 2057   LABSPEC 1.011 10/01/2013 2057   PHURINE 5.0 10/01/2013 2057   GLUCOSEU Negative 10/01/2013 2057   HGBUR Negative 10/01/2013 2057   BILIRUBINUR neg 06/01/2017 1049   BILIRUBINUR Negative 10/01/2013 2057   KETONESUR Negative 10/01/2013 2057   PROTEINUR neg 06/01/2017 1049   PROTEINUR Negative 10/01/2013 2057   UROBILINOGEN 0.2 06/01/2017 1049   NITRITE neg 06/01/2017 1049   NITRITE Negative 10/01/2013 2057   LEUKOCYTESUR Negative 06/01/2017 1049   LEUKOCYTESUR Negative 10/01/2013 2057    Pertinent Imaging: none  Assessment & Plan:  The patient  has mixed incontinence but significant leakage not associated with awareness since an anterior repair and repair of an enterocele. She had mild mixed incontinence prior. She has nocturia and hourly frequency. The patient likely has unmasked her stress incontinence with her prolapse surgery. The role of urodynamics and cystoscopy discussed. I agree that based upon today's findings likely a sling cystourethropexy will be offered to help her incontinence.   1. Stress incontinence 2. Mixed incontinence 3. Nighttime frequency   - Urinalysis, Complete - BLADDER SCAN AMB NON-IMAGING   No Follow-up on file.  Martina Sinner, MD  [facility] [address], [address] [place], [place] [address] [phone]

## 2017-07-26 ENCOUNTER — Other Ambulatory Visit: Payer: Self-pay | Admitting: Urology

## 2017-08-04 ENCOUNTER — Encounter: Payer: Self-pay | Admitting: Urology

## 2017-08-04 ENCOUNTER — Ambulatory Visit (INDEPENDENT_AMBULATORY_CARE_PROVIDER_SITE_OTHER): Payer: Medicare Other | Admitting: Urology

## 2017-08-04 VITALS — BP 163/82 | HR 78 | Ht 64.0 in | Wt 156.9 lb

## 2017-08-04 DIAGNOSIS — N3946 Mixed incontinence: Secondary | ICD-10-CM

## 2017-08-04 NOTE — Progress Notes (Signed)
08/04/2017 4:35 PM   Kara Sullivan 03/21/1941 [id number]  Referring provider: Kandyce RudBabaoff, Marcus, MD [phone] [address] [facility] [place] [facility] [place], [place] [id number]  Chief Complaint  Patient presents with  . Follow-up    UDS results    HPI: The patient is approximately 10 weeks since an anterior and posterior repair and repair of enterocele. It appears the patient started having stress incontinence after surgery. The patient had been given the beta 3 agonist. What I reviewed the medical records she may have just had a repair of enterocele and no rectocele.  Prior to surgery by history she had mild stress and urge incontinence. She was wearing one to 2 liners a day that were mildly wet or were present for safety. She had no bedwetting  Currently she leaks with coughing sneezing bending and standing. She can leak without awareness even with sitting. She does believe it is urethral leaking. She can have urgency incontinence but she is not having a lot of her urgency. She has little to no bedwetting. The beta 3 agonists was started after surgery and she was not treated with medication prior. She is wearing at least 8 pads a day that can be soaked and she utilizes a double pad system  She is voiding every 2 or 3 hours and hourly during the day and she cannot hold urination for 2 hours.  The patient has mixed incontinence but significant leakage not associated with awareness since an anterior repair and repair of an enterocele. She had mild mixed incontinence prior. She has nocturia and hourly frequency. The patient likely has unmasked her stress incontinence with her prolapse surgery. The role of urodynamics and cystoscopy discussed. I agree that based upon today's findings likely a sling cystourethropexy will be offered to help her incontinence.   Today Incontinence and frequency are stable On urodynamics the patient 30 minutes after voiding was catheterized  for 100 mL.  Bladder capacity was 190 mL.  Bladder was unstable reaching a pressure of 6 cmH2O associated with severe leakage.  They were provoked by coughing.  At 100 mL her Valsalva leak point pressure was 37 cm of water associated with moderate leakage.  I do 125 mL it was 17 cm of water.  She was triggering.  During voluntary voiding she voided 150 mL with Noxafil 4 mils per second.  Maximum voiding pressure 6 cm of water.  Residual 75 mL.  EMG activity increased during the voiding phase.  Bladder was very hypersensitive.  She had some funneling of the bladder neck.  Details of the urinary % dictated   PMH: Past Medical History:  Diagnosis Date  . 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 Diabetes mellitus without complication (HCC)   . Frequent urination 04/16/2016  . High cholesterol   . Hypertension   . Stress incontinence 04/16/2016  . Vaginal atrophy 04/16/2016    Surgical History: Past Surgical History:  Procedure Laterality Date  . ABDOMINAL HYSTERECTOMY    . BLADDER SUSPENSION  2018  . CARPAL TUNNEL RELEASE Right   . CYSTOCELE REPAIR N/A 04/19/2017   Procedure: ANTERIOR COLPORRHAPY WITH KELLY PLICATION AND ENTEROCELE LIGATION;  Surgeon: Herold Harmsefrancesco, Martin A, MD;  Location: [facility] ORS;  Service: Gynecology;  Laterality: N/A;  . OOPHORECTOMY      Home Medications:  Allergies as of 08/04/2017      Reactions   Atorvastatin Other (See Comments)   Ciprofloxacin Swelling   Rosuvastatin Other (See Comments)   Elevated liver  enzymes   Sulfa Antibiotics Nausea And Vomiting      Medication List       Accurate as of 08/04/17  4:35 PM. Always use your most recent med list.          acetaminophen 500 MG tablet Commonly known as:  TYLENOL Take 1,000 mg by mouth every 8 (eight) hours as needed for mild pain.   aspirin EC 81 MG tablet Take 81 mg by mouth daily.   calcium-vitamin D 250-125 MG-UNIT tablet Commonly known as:  OSCAL WITH D Take 1 tablet by mouth daily.   ferrous sulfate 325 (65 FE)  MG tablet Take 1 tablet (325 mg total) by mouth daily with breakfast.   Fish Oil 1000 MG Caps Take 1,000 mg by mouth daily.   lisinopril-hydrochlorothiazide 20-12.5 MG tablet Commonly known as:  PRINZIDE,ZESTORETIC Take 1 tablet by mouth daily.   meloxicam 15 MG tablet Commonly known as:  MOBIC Take 15 mg by mouth daily as needed for pain.   metFORMIN 500 MG 24 hr tablet Commonly known as:  GLUCOPHAGE-XR Take 500 mg by mouth daily with supper.   mirabegron ER 25 MG Tb24 tablet Commonly known as:  MYRBETRIQ Take 1 tablet (25 mg total) by mouth daily.   multivitamin with minerals Tabs tablet Take 1 tablet by mouth daily. For Women   triamcinolone cream 0.1 % Commonly known as:  KENALOG Apply 1 application topically daily as needed (rash).   ZETIA 10 MG tablet Generic drug:  ezetimibe Take 10 mg by mouth daily.       Allergies:  Allergies  Allergen Reactions  . Atorvastatin Other (See Comments)  . Ciprofloxacin Swelling  . Rosuvastatin Other (See Comments)    Elevated liver enzymes  . Sulfa Antibiotics Nausea And Vomiting    Family History: Family History  Problem Relation Age of Onset  . Breast cancer Maternal Aunt 37  . Heart attack Father   . Diabetes Neg Hx   . Colon cancer Neg Hx   . Ovarian cancer Neg Hx     Social History:  reports that she has never smoked. She has never used smokeless tobacco. She reports that she does not drink alcohol or use drugs.  ROS: UROLOGY Frequent Urination?: Yes Hard to postpone urination?: Yes Burning/pain with urination?: No Get up at night to urinate?: Yes Leakage of urine?: Yes Urine stream starts and stops?: Yes Trouble starting stream?: No Do you have to strain to urinate?: No Blood in urine?: No Urinary tract infection?: No Sexually transmitted disease?: No Injury to kidneys or bladder?: No Painful intercourse?: No Weak stream?: No Currently pregnant?: No Vaginal bleeding?: No Last menstrual period?:  n  Gastrointestinal Nausea?: No Vomiting?: No Indigestion/heartburn?: No Diarrhea?: No Constipation?: No  Constitutional Fever: No Night sweats?: No Weight loss?: No Fatigue?: No  Skin Skin rash/lesions?: No Itching?: No  Eyes Blurred vision?: No Double vision?: No  Ears/Nose/Throat Sore throat?: No Sinus problems?: No  Hematologic/Lymphatic Swollen glands?: No Easy bruising?: No  Cardiovascular Leg swelling?: No Chest pain?: No  Respiratory Cough?: No Shortness of breath?: No  Endocrine Excessive thirst?: No  Musculoskeletal Back pain?: No Joint pain?: No  Neurological Headaches?: No Dizziness?: No  Psychologic Depression?: No Anxiety?: No  Physical Exam:   Laboratory Data: Lab Results  Component Value Date   WBC 12.1 (H) 10/01/2013   HGB 9.4 (L) 04/20/2017   HCT 34.0 (L) 04/19/2017   MCV 90 10/01/2013   PLT 464 (H) 10/01/2013  Lab Results  Component Value Date   CREATININE 1.16 10/01/2013    No results found for: PSA  No results found for: TESTOSTERONE  No results found for: HGBA1C  Urinalysis    Component Value Date/Time   COLORURINE Straw 10/01/2013 2057   APPEARANCEUR Clear 06/29/2017 1002   LABSPEC 1.011 10/01/2013 2057   PHURINE 5.0 10/01/2013 2057   GLUCOSEU Negative 06/29/2017 1002   GLUCOSEU Negative 10/01/2013 2057   HGBUR Negative 10/01/2013 2057   BILIRUBINUR Negative 06/29/2017 1002   BILIRUBINUR Negative 10/01/2013 2057   KETONESUR Negative 10/01/2013 2057   PROTEINUR Negative 06/29/2017 1002   PROTEINUR Negative 10/01/2013 2057   UROBILINOGEN 0.2 06/01/2017 1049   NITRITE Negative 06/29/2017 1002   NITRITE Negative 10/01/2013 2057   LEUKOCYTESUR Negative 06/29/2017 1002   LEUKOCYTESUR Negative 10/01/2013 2057    Pertinent Imaging: none  Assessment & Plan: The patient has significant stress incontinence urodynamically but has impressive overactive bladder with small capacity.  Postoperative urge or  worsening incontinence with a sling would need to be discussed.  I would like to give her a trial of antimuscarinics and he only had Toviaz samples.  Reassess in about a month.  Discussed the sling then.  Try 1 more antimuscarinic prior to surgery would be very reasonable.  Reassess in 1 month on Toviaz 4 mg to help.  Again I was very polite and I can empathize with the patient but I will not change my treatment algorithm especially with a small spastic bladder.  She is at much higher risk of not doing well with surgery.  I will schedule the surgery quickly when I see her next time pending results  There are no diagnoses linked to this encounter.  No Follow-up on file.  Martina Sinner, MD  [facility] [address], [address] [place], [place] [address] [phone]

## 2017-09-01 ENCOUNTER — Ambulatory Visit: Payer: Medicare Other | Admitting: Obstetrics and Gynecology

## 2017-09-01 ENCOUNTER — Encounter: Payer: Self-pay | Admitting: Obstetrics and Gynecology

## 2017-09-01 VITALS — BP 156/85 | HR 81 | Ht 64.0 in | Wt 163.2 lb

## 2017-09-01 DIAGNOSIS — N393 Stress incontinence (female) (male): Secondary | ICD-10-CM

## 2017-09-01 DIAGNOSIS — Z09 Encounter for follow-up examination after completed treatment for conditions other than malignant neoplasm: Secondary | ICD-10-CM

## 2017-09-01 DIAGNOSIS — N3289 Other specified disorders of bladder: Secondary | ICD-10-CM

## 2017-09-01 NOTE — Progress Notes (Signed)
Chief complaint: 1.  Mixed incontinence 2.  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  She had a history of mixed incontinence prior to surgery.  Postop patient has had an unmasking of her stress incontinence to a worsening degree.  Mixed incontinence has been treated with Myrbetriq initially, followed by Toviaz.  She has seen urology who is working up these conditions.  She is somewhat frustrated because the stress incontinence is extreme to where it is compromising her activities.  She has follow-up with urology in the next week.  The patient reports having normal bladder function at night when she gets up to go to the bathroom.  Also early morning when she arises she has normal bladder function.  However, throughout the remainder of the day she will develop stress incontinence and spontaneous bladder leakage at various times when her bladder gets full.  Past medical history, past surgical history, problem list, medications, and allergies are reviewed  OBJECTIVE: BP (!) 156/85   Pulse 81   Ht 5\' 4"  (1.626 m)   Wt 163 lb 3.2 oz (74 kg)   BMI 28.01 kg/m    Pleasant, well-appearing, elderly female in no acute distress Abdomen: Soft, nontender. Pelvic exam: External genitalia-normal BUS-normal. Vagina- The vagina demonstrates a grade 1 cystocele; there is good apex vaginal support; there is no rectocele.  The vagina has a depth of approximately 6 cm.  With Valsalva, there is minimal rotational descent of the bladder neck; Q tip test reveals rotation with Valsalva approximately 30 degrees and urine leakage during Valsalva Bimanual-no palpable masses or tenderness  ASSESSMENT: 1.  Status post pelvic organ prolapse surgery for second-degree to third-degree cystocele, rectocele, and enterocele 2.  Mixed incontinence, with fair control with Toviaz therapy (nocturia  x2; no significant urge) 3.  Stress urinary incontinence, exacerbated post surgery, compromising routine activities of daily living. 4.  Urology records reviewed with Dr. McDermott's assessment and recommendations-small bladder capacity with significant spastic component; at risk for postoperative urge or worsening incontinence with sling, proceeding cautiously  PLAN: 1.  Continue Toviaz 2.  Maintain follow-up appointment with urology as scheduled next week 3.  Likely will need pubovaginal sling to help manage stress incontinence component of the mixed incontinence picture, not without risk. 4.  Return in 3 months for follow-up.  A total of 15 minutes were spent face-to-face with the patient during this encounter and over half of that time dealt with counseling and coordination of care.  Herold HarmsMartin A Hayla Hinger, MD  Note: This dictation was prepared with Dragon dictation along with smaller phrase technology. Any transcriptional errors that result from this process are unintentional.

## 2017-09-01 NOTE — Patient Instructions (Signed)
1.  Continue with Toviaz medicine as prescribed by urology 2.  Maintain urology follow-up in 5 days as scheduled 3.  If there is delay in scheduling of pubovaginal sling, please contact the office for considering alternatives

## 2017-09-06 ENCOUNTER — Ambulatory Visit: Payer: Medicare Other

## 2017-09-06 ENCOUNTER — Ambulatory Visit: Payer: Medicare Other | Admitting: Urology

## 2017-09-06 VITALS — BP 184/74 | HR 92 | Ht 64.0 in | Wt 160.7 lb

## 2017-09-06 DIAGNOSIS — N3946 Mixed incontinence: Secondary | ICD-10-CM | POA: Diagnosis not present

## 2017-09-06 NOTE — Progress Notes (Signed)
09/06/2017 3:07 PM   Kara Sullivan 08/10/1941 [id number]  Referring provider: Kandyce RudBabaoff, Marcus, MD [phone] [address] [facility] [place] [facility] [place], [place] [id number]  Chief Complaint  Patient presents with  . Urinary Incontinence    HPI: The patient is approximately 10 weeks since an anterior and posterior repair and repair of enterocele. It appears the patient started having stress incontinence after surgery. The patient had been given the beta 3 agonist. What I reviewed the medical records she may have just had a repair of enterocele and no rectocele.  Prior to surgery by history she had mild stress and urge incontinence. She was wearing one to 2 liners a day that were mildly wet or were present for safety. She had no bedwetting  Currently she leaks with coughing sneezing bending and standing. She can leak without awareness even with sitting. She does believe it is urethral leaking. She can have urgency incontinence but she is not having a lot of her urgency. She has little to no bedwetting. The beta 3 agonists was started after surgery and she was not treated with medication prior. She is wearing at least 8 pads a day that can be soaked and she utilizes a double pad system  She is voiding every 2 or 3 hours and hourly during the day and she cannot hold urination for 2 hours.  The patient has mixed incontinence but significant leakage not associated with awareness since an anterior repair and repair of an enterocele. She had mild mixed incontinence prior. She has nocturia and hourly frequency. The patient likely has unmasked her stress incontinence with her prolapse surgery. The role of urodynamics and cystoscopy discussed. I agree that based upon today's findings likely a sling cystourethropexy will be offered to help her incontinence.   Today Incontinence and frequency are stable On urodynamics the patient 30 minutes after voiding was catheterized for  100 mL.  Bladder capacity was 190 mL.  Bladder was unstable reaching a pressure of 6 cmH2O associated with severe leakage.  They were provoked by coughing.  At 100 mL her Valsalva leak point pressure was 37 cm of water associated with moderate leakage.  I do 125 mL it was 17 cm of water.  She was triggering.  During voluntary voiding she voided 150 mL with Noxafil 4 mils per second.  Maximum voiding pressure 6 cm of water.  Residual 75 mL.  EMG activity increased during the voiding phase.  Bladder was very hypersensitive.  She had some funneling of the bladder neck.  Details of the urodynamics are signed and dictated  The patient has significant stress incontinence urodynamically but has impressive overactive bladder with small capacity.  Postoperative urge or worsening incontinence with a sling would need to be discussed.  I would like to give her a trial of antimuscarinics and only had Toviaz samples.  Reassess in about a month.  Discussed the sling then.  Try 1 more antimuscarinic prior to surgery would be very reasonable.  Reassess in 1 month on Toviaz 4 mg to help.  Again I was very polite and I can empathize with the patient but I will not change my treatment algorithm especially with a small spastic bladder.  She is at much higher risk of not doing well with surgery.  I will schedule the surgery quickly when I see her next time pending results  Today Frequency stable and I reviewed gynecology note Patient still has incontinence.  Clinically not infected  PMH: Past Medical History:  Diagnosis Date  . 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 Diabetes mellitus without complication (HCC)   . Frequent urination 04/16/2016  . High cholesterol   . Hypertension   . Stress incontinence 04/16/2016  . Vaginal atrophy 04/16/2016    Surgical History: Past Surgical History:  Procedure Laterality Date  . ABDOMINAL HYSTERECTOMY    . BLADDER SUSPENSION  2018  . CARPAL TUNNEL RELEASE Right   . CYSTOCELE REPAIR N/A  04/19/2017   Procedure: ANTERIOR COLPORRHAPY WITH KELLY PLICATION AND ENTEROCELE LIGATION;  Surgeon: Herold Harmsefrancesco, Martin A, MD;  Location: [facility] ORS;  Service: Gynecology;  Laterality: N/A;  . OOPHORECTOMY      Home Medications:  Allergies as of 09/06/2017      Reactions   Atorvastatin Other (See Comments)   Ciprofloxacin Swelling   Rosuvastatin Other (See Comments)   Elevated liver enzymes   Sulfa Antibiotics Nausea And Vomiting      Medication List        Accurate as of 09/06/17  3:07 PM. Always use your most recent med list.          aspirin EC 81 MG tablet Take 81 mg by mouth daily.   calcium-vitamin D 250-125 MG-UNIT tablet Commonly known as:  OSCAL WITH D Take 1 tablet by mouth daily.   Fish Oil 1000 MG Caps Take 1,000 mg by mouth daily.   lisinopril-hydrochlorothiazide 20-12.5 MG tablet Commonly known as:  PRINZIDE,ZESTORETIC Take 1 tablet by mouth daily.   metFORMIN 500 MG 24 hr tablet Commonly known as:  GLUCOPHAGE-XR Take 500 mg by mouth daily with supper.   multivitamin with minerals Tabs tablet Take 1 tablet by mouth daily. For Women   TOVIAZ 4 MG Tb24 tablet Generic drug:  fesoterodine Take 4 mg by mouth daily.   triamcinolone cream 0.1 % Commonly known as:  KENALOG Apply 1 application topically daily as needed (rash).   ZETIA 10 MG tablet Generic drug:  ezetimibe Take 10 mg by mouth daily.       Allergies:  Allergies  Allergen Reactions  . Atorvastatin Other (See Comments)  . Ciprofloxacin Swelling  . Rosuvastatin Other (See Comments)    Elevated liver enzymes  . Sulfa Antibiotics Nausea And Vomiting    Family History: Family History  Problem Relation Age of Onset  . Breast cancer Maternal Aunt [id number]  . Heart attack Father   . Diabetes Neg Hx   . Colon cancer Neg Hx   . Ovarian cancer Neg Hx     Social History:  reports that  has never smoked. she has never used smokeless tobacco. She reports that she does not drink alcohol or use  drugs.  ROS:                                        Physical Exam: BP (!) 184/74   Pulse 92   Ht 5\' 4"  (1.626 m)   Wt 160 lb 11.2 oz (72.9 kg)   BMI 27.58 kg/m   Constitutional:  Alert and oriented, No acute distress.  Laboratory Data: Lab Results  Component Value Date   WBC 12.1 (H) 10/01/2013   HGB 9.4 (L) 04/20/2017   HCT 34.0 (L) 04/19/2017   MCV 90 10/01/2013   PLT 464 (H) 10/01/2013    Lab Results  Component Value Date   CREATININE 1.16 10/01/2013    No results found for:  PSA  No results found for: TESTOSTERONE  No results found for: HGBA1C  Urinalysis    Component Value Date/Time   COLORURINE Straw 10/01/2013 2057   APPEARANCEUR Clear 06/29/2017 1002   LABSPEC 1.011 10/01/2013 2057   PHURINE 5.0 10/01/2013 2057   GLUCOSEU Negative 06/29/2017 1002   GLUCOSEU Negative 10/01/2013 2057   HGBUR Negative 10/01/2013 2057   BILIRUBINUR Negative 06/29/2017 1002   BILIRUBINUR Negative 10/01/2013 2057   KETONESUR Negative 10/01/2013 2057   PROTEINUR Negative 06/29/2017 1002   PROTEINUR Negative 10/01/2013 2057   UROBILINOGEN 0.2 06/01/2017 1049   NITRITE Negative 06/29/2017 1002   NITRITE Negative 10/01/2013 2057   LEUKOCYTESUR Negative 06/29/2017 1002   LEUKOCYTESUR Negative 10/01/2013 2057    Pertinent Imaging: None  Assessment & Plan: I drew the patient a picture.  I discussed watchful waiting in a sling in detail with my usual template.  I made it very clear that her overactive bladder could persist in about 40% of cases and worsened in a few percent.  She understands that she has a very significant overactive bladder on urodynamics.  She understands that she may not be feeling urgency now because of her significant stress incontinence.  We talked about the timing relative to surgery for both her stress and urge incontinence and how they may have been unmasked or worsened with prolapse surgery.  She does not want to try Vesicare or  generic pill.  I did mention the 3 refractory therapies when her daughter asked me what could be done if the sling does not address the overactive bladder component.  We will proceed with surgery as soon as possible as promised.    There are no diagnoses linked to this encounter.  No Follow-up on file.  Martina Sinner, MD  [facility] [address], [address] [place], [place] [address] [phone]

## 2017-09-06 NOTE — Patient Instructions (Signed)
See PRN; will call to f/u

## 2017-09-07 ENCOUNTER — Other Ambulatory Visit: Payer: Self-pay | Admitting: Radiology

## 2017-09-07 DIAGNOSIS — N393 Stress incontinence (female) (male): Secondary | ICD-10-CM

## 2017-10-13 ENCOUNTER — Other Ambulatory Visit: Payer: Self-pay

## 2017-10-13 ENCOUNTER — Encounter
Admission: RE | Admit: 2017-10-13 | Discharge: 2017-10-13 | Disposition: A | Discharge status: Home / Self Care | Payer: Medicare Other | Source: Ambulatory Visit | Attending: Urology | Admitting: Urology

## 2017-10-13 ENCOUNTER — Ambulatory Visit (INDEPENDENT_AMBULATORY_CARE_PROVIDER_SITE_OTHER): Payer: Medicare Other

## 2017-10-13 VITALS — BP 152/77 | HR 85 | Ht 64.0 in | Wt 162.0 lb

## 2017-10-13 DIAGNOSIS — N393 Stress incontinence (female) (male): Secondary | ICD-10-CM | POA: Diagnosis not present

## 2017-10-13 DIAGNOSIS — R05 Cough: Secondary | ICD-10-CM | POA: Insufficient documentation

## 2017-10-13 DIAGNOSIS — Z01812 Encounter for preprocedural laboratory examination: Secondary | ICD-10-CM | POA: Insufficient documentation

## 2017-10-13 HISTORY — DX: Personal history of other diseases of the digestive system: Z87.19

## 2017-10-13 LAB — CBC
HEMATOCRIT: 33.4 % — AB (ref 35.0–47.0)
Hemoglobin: 11.4 g/dL — ABNORMAL LOW (ref 12.0–16.0)
MCH: 31.6 pg (ref 26.0–34.0)
MCHC: 34 g/dL (ref 32.0–36.0)
MCV: 93.1 fL (ref 80.0–100.0)
PLATELETS: 517 10*3/uL — AB (ref 150–440)
RBC: 3.59 MIL/uL — ABNORMAL LOW (ref 3.80–5.20)
RDW: 13 % (ref 11.5–14.5)
WBC: 11.9 10*3/uL — ABNORMAL HIGH (ref 3.6–11.0)

## 2017-10-13 LAB — BASIC METABOLIC PANEL
ANION GAP: 12 (ref 5–15)
BUN: 23 mg/dL — ABNORMAL HIGH (ref 6–20)
CALCIUM: 10 mg/dL (ref 8.9–10.3)
CO2: 26 mmol/L (ref 22–32)
Chloride: 100 mmol/L — ABNORMAL LOW (ref 101–111)
Creatinine, Ser: 1.07 mg/dL — ABNORMAL HIGH (ref 0.44–1.00)
GFR calc non Af Amer: 50 mL/min — ABNORMAL LOW (ref >60)
GFR, EST AFRICAN AMERICAN: 58 mL/min — AB (ref >60)
Glucose, Bld: 123 mg/dL — ABNORMAL HIGH (ref 65–99)
Potassium: 4.1 mmol/L (ref 3.5–5.1)
Sodium: 138 mmol/L (ref 135–145)

## 2017-10-13 NOTE — Pre-Procedure Instructions (Signed)
Pt reports she is coughing up and blowing out yellow colored mucous, this has been going on for 5 days.  Pt has not seen her PCP or been started on antibiotics.

## 2017-10-13 NOTE — Progress Notes (Signed)
Continuous Intermittent Catheterization  Due to upcoming bladder surgery patient is present today for a teaching of self I & O Catheterization. Patient was given detailed verbal and printed instructions of self catheterization. Patient was cleaned and prepped in a sterile fashion.  With instruction and assistance patient inserted a 14FR and urine return was noted 50 ml, urine was yellow in color. Patient tolerated well, no complications were noted Patient was given a sample bag with supplies to take home.  Instructions were given per Dr. Sherron MondayMacDiarmid for patient to cath PRN times daily.    Preformed by: Rupert Stackshelsea Watkins, LPN   Blood pressure (!) 152/77, pulse 85, height 5\' 4"  (1.626 m), weight 162 lb (73.5 kg).

## 2017-10-13 NOTE — Patient Instructions (Signed)
  Your procedure is scheduled WU:[id number]:Monday Jan. 7th , 2019. Report to Same Day Surgery. To find out your arrival time please call [phone] between 1PM - 3PM on Friday Jan.4th, 2019 .  Remember: Instructions that are not followed completely may result in serious medical risk, up to and including death, or upon the discretion of your surgeon and anesthesiologist your surgery may need to be rescheduled.    _x___ 1. Do not eat food after midnight night prior to surgery.     No gum chewing or hard candies, snacks or breakfast.     May drink the following up until 2 hours prior to ARRIVAL:      water     ____ 2. No Alcohol for 24 hours before or after surgery.   ____ 3. Bring all medications with you on the day of surgery if instructed.    __x__ 4. Notify your doctor if there is any change in your medical condition     (cold, fever, infections).    _____ 5.   Do Not Smoke or use e-cigarettes For 24 Hours Prior to Your   Surgery.  Do not use any chewable tobacco products for at least 6   hours prior to  surgery.                  Do not wear jewelry, make-up, hairpins, clips or nail polish.  Do not wear lotions, powders, or perfumes.   Do not shave 48 hours prior to surgery. Men may shave face and neck.  Do not bring valuables to the hospital.    [facility] is not responsible for any belongings or valuables.               Contacts, dentures or bridgework may not be worn into surgery.  Leave your suitcase in the car. After surgery it may be brought to your room.  For patients admitted to the hospital, discharge time is determined by your  treatment team.   Patients discharged the day of surgery will not be allowed to drive home.    Please read over the following fact sheets that you were given:   [facility] Preparing [facility]             _x___ Take these medicines the morning of surgery with A SIP OF WATER:    1. ezetimibe (ZETIA)  2.   3.    4.  5.  6.     ____ Fleet Enema (as directed)   ____ Use CHG Soap as directed on instruction sheet  ____ Use inhalers on the day of surgery and bring to hospital day of surgery  __x__ Stop metformin 2 days prior to surgery    ____ Take 1/2 of usual insulin dose the night before surgery and none on the morning of          surgery.   _x___ Stop aspirin today as instructed.  _x___ Stop Anti-inflammatories such as Advil, Aleve, Ibuprofen, Motrin, Naproxen,  Naprosyn, Goodies powders or aspirin products. OK to take Tylenol.   __x__ Stop supplements: Omega-3 Fatty Acids (FISH OIL) until after surgery.    ____ Bring C-Pap to the hospital.

## 2017-10-17 MED ORDER — CEFAZOLIN SODIUM-DEXTROSE 2-4 GM/100ML-% IV SOLN
2.0000 g | INTRAVENOUS | Status: AC
  Administered 2017-10-18: 2 g via INTRAVENOUS

## 2017-10-17 MED ORDER — CLINDAMYCIN PHOSPHATE 600 MG/50ML IV SOLN
600.0000 mg | INTRAVENOUS | Status: AC
  Administered 2017-10-18: 600 mg via INTRAVENOUS

## 2017-10-18 ENCOUNTER — Ambulatory Visit: Payer: Medicare Other | Admitting: Registered Nurse

## 2017-10-18 ENCOUNTER — Encounter
Admission: RE | Disposition: A | Discharge status: Home / Self Care | Payer: Self-pay | Source: Ambulatory Visit | Attending: Urology

## 2017-10-18 ENCOUNTER — Encounter: Payer: Self-pay | Admitting: *Deleted

## 2017-10-18 ENCOUNTER — Ambulatory Visit
Admission: RE | Admit: 2017-10-18 | Discharge: 2017-10-18 | Disposition: A | Discharge status: Home / Self Care | Payer: Medicare Other | Source: Ambulatory Visit | Attending: Urology | Admitting: Urology

## 2017-10-18 ENCOUNTER — Other Ambulatory Visit: Payer: Self-pay

## 2017-10-18 DIAGNOSIS — Z803 Family history of malignant neoplasm of breast: Secondary | ICD-10-CM | POA: Diagnosis not present

## 2017-10-18 DIAGNOSIS — Z8249 Family history of ischemic heart disease and other diseases of the circulatory system: Secondary | ICD-10-CM | POA: Insufficient documentation

## 2017-10-18 DIAGNOSIS — E7800 Pure hypercholesterolemia, unspecified: Secondary | ICD-10-CM | POA: Diagnosis not present

## 2017-10-18 DIAGNOSIS — Z79899 Other long term (current) drug therapy: Secondary | ICD-10-CM | POA: Insufficient documentation

## 2017-10-18 DIAGNOSIS — N393 Stress incontinence (female) (male): Secondary | ICD-10-CM

## 2017-10-18 DIAGNOSIS — Z882 Allergy status to sulfonamides status: Secondary | ICD-10-CM | POA: Insufficient documentation

## 2017-10-18 DIAGNOSIS — I10 Essential (primary) hypertension: Secondary | ICD-10-CM | POA: Diagnosis not present

## 2017-10-18 DIAGNOSIS — Z7982 Long term (current) use of aspirin: Secondary | ICD-10-CM | POA: Insufficient documentation

## 2017-10-18 DIAGNOSIS — N3946 Mixed incontinence: Secondary | ICD-10-CM | POA: Insufficient documentation

## 2017-10-18 DIAGNOSIS — E119 Type 2 diabetes mellitus without complications: Secondary | ICD-10-CM | POA: Diagnosis not present

## 2017-10-18 DIAGNOSIS — N3281 Overactive bladder: Secondary | ICD-10-CM | POA: Diagnosis not present

## 2017-10-18 DIAGNOSIS — Z881 Allergy status to other antibiotic agents status: Secondary | ICD-10-CM | POA: Diagnosis not present

## 2017-10-18 DIAGNOSIS — Z7984 Long term (current) use of oral hypoglycemic drugs: Secondary | ICD-10-CM | POA: Diagnosis not present

## 2017-10-18 HISTORY — PX: PUBOVAGINAL SLING: SHX1035

## 2017-10-18 LAB — GLUCOSE, CAPILLARY
GLUCOSE-CAPILLARY: 123 mg/dL — AB (ref 65–99)
GLUCOSE-CAPILLARY: 108 mg/dL — AB (ref 65–99)

## 2017-10-18 SURGERY — CREATION, PUBOVAGINAL SLING
Anesthesia: General | Wound class: Clean Contaminated

## 2017-10-18 MED ORDER — ONDANSETRON HCL 4 MG/2ML IJ SOLN
INTRAMUSCULAR | Status: DC | PRN
  Administered 2017-10-18: 4 mg via INTRAVENOUS

## 2017-10-18 MED ORDER — FENTANYL CITRATE (PF) 100 MCG/2ML IJ SOLN
INTRAMUSCULAR | Status: AC
  Administered 2017-10-18: 25 ug via INTRAVENOUS
  Filled 2017-10-18: qty 2

## 2017-10-18 MED ORDER — FENTANYL CITRATE (PF) 100 MCG/2ML IJ SOLN
25.0000 ug | INTRAMUSCULAR | Status: DC | PRN
  Administered 2017-10-18 (×2): 25 ug via INTRAVENOUS

## 2017-10-18 MED ORDER — METHYLENE BLUE 0.5 % INJ SOLN
INTRAVENOUS | Status: AC
  Filled 2017-10-18: qty 10

## 2017-10-18 MED ORDER — ESTROGENS, CONJUGATED 0.625 MG/GM VA CREA
TOPICAL_CREAM | VAGINAL | Status: AC
  Filled 2017-10-18: qty 30

## 2017-10-18 MED ORDER — PHENYLEPHRINE HCL 10 MG/ML IJ SOLN
INTRAMUSCULAR | Status: DC | PRN
  Administered 2017-10-18: 100 ug via INTRAVENOUS

## 2017-10-18 MED ORDER — FAMOTIDINE 20 MG PO TABS
20.0000 mg | ORAL_TABLET | Freq: Once | ORAL | Status: AC
  Administered 2017-10-18: 20 mg via ORAL

## 2017-10-18 MED ORDER — CLINDAMYCIN PHOSPHATE 600 MG/50ML IV SOLN
INTRAVENOUS | Status: AC
  Filled 2017-10-18: qty 50

## 2017-10-18 MED ORDER — DEXAMETHASONE SODIUM PHOSPHATE 10 MG/ML IJ SOLN
INTRAMUSCULAR | Status: DC | PRN
  Administered 2017-10-18: 5 mg via INTRAVENOUS

## 2017-10-18 MED ORDER — FLUORESCEIN SODIUM 10 % IV SOLN
INTRAVENOUS | Status: AC
  Filled 2017-10-18: qty 5

## 2017-10-18 MED ORDER — LIDOCAINE-EPINEPHRINE (PF) 1 %-1:200000 IJ SOLN
INTRAMUSCULAR | Status: DC | PRN
  Administered 2017-10-18: 4 mL

## 2017-10-18 MED ORDER — MIDAZOLAM HCL 2 MG/2ML IJ SOLN
INTRAMUSCULAR | Status: DC | PRN
  Administered 2017-10-18: 2 mg via INTRAVENOUS

## 2017-10-18 MED ORDER — FENTANYL CITRATE (PF) 100 MCG/2ML IJ SOLN
INTRAMUSCULAR | Status: AC
  Filled 2017-10-18: qty 2

## 2017-10-18 MED ORDER — HYDROCODONE-ACETAMINOPHEN 5-325 MG PO TABS
1.0000 | ORAL_TABLET | Freq: Four times a day (QID) | ORAL | Status: DC | PRN
  Administered 2017-10-18: 1 via ORAL

## 2017-10-18 MED ORDER — PROPOFOL 10 MG/ML IV BOLUS
INTRAVENOUS | Status: DC | PRN
  Administered 2017-10-18: 100 mg via INTRAVENOUS
  Administered 2017-10-18: 30 mg via INTRAVENOUS
  Administered 2017-10-18: 20 mg via INTRAVENOUS

## 2017-10-18 MED ORDER — FENTANYL CITRATE (PF) 100 MCG/2ML IJ SOLN
INTRAMUSCULAR | Status: DC | PRN
  Administered 2017-10-18 (×2): 50 ug via INTRAVENOUS
  Administered 2017-10-18 (×2): 25 ug via INTRAVENOUS

## 2017-10-18 MED ORDER — LIDOCAINE HCL (CARDIAC) 20 MG/ML IV SOLN
INTRAVENOUS | Status: DC | PRN
  Administered 2017-10-18: 100 mg via INTRAVENOUS

## 2017-10-18 MED ORDER — GLYCOPYRROLATE 0.2 MG/ML IJ SOLN
INTRAMUSCULAR | Status: DC | PRN
  Administered 2017-10-18: 0.2 mg via INTRAVENOUS

## 2017-10-18 MED ORDER — MIDAZOLAM HCL 2 MG/2ML IJ SOLN
INTRAMUSCULAR | Status: AC
  Filled 2017-10-18: qty 2

## 2017-10-18 MED ORDER — CEFAZOLIN SODIUM-DEXTROSE 2-4 GM/100ML-% IV SOLN
INTRAVENOUS | Status: AC
  Filled 2017-10-18: qty 100

## 2017-10-18 MED ORDER — ONDANSETRON HCL 4 MG/2ML IJ SOLN: 4.0000 mg | Freq: Once | INTRAMUSCULAR | Status: DC | PRN

## 2017-10-18 MED ORDER — FLUORESCEIN SODIUM 10 % IV SOLN
INTRAVENOUS | Status: DC | PRN
Start: 2017-10-18 — End: 2017-10-18
  Administered 2017-10-18 (×2): .5 mL via INTRAVENOUS

## 2017-10-18 MED ORDER — STERILE WATER FOR IRRIGATION IR SOLN
Status: DC | PRN
  Administered 2017-10-18: 1500 mL

## 2017-10-18 MED ORDER — LIDOCAINE-EPINEPHRINE (PF) 1 %-1:200000 IJ SOLN
INTRAMUSCULAR | Status: AC
  Filled 2017-10-18: qty 30

## 2017-10-18 MED ORDER — SODIUM CHLORIDE 0.9 % IV SOLN
INTRAVENOUS | Status: DC
  Administered 2017-10-18: 11:00:00 via INTRAVENOUS

## 2017-10-18 MED ORDER — HYDROCODONE-ACETAMINOPHEN 5-325 MG PO TABS
ORAL_TABLET | ORAL | Status: AC
  Administered 2017-10-18: 1 via ORAL
  Filled 2017-10-18: qty 1

## 2017-10-18 MED ORDER — DEXAMETHASONE SODIUM PHOSPHATE 10 MG/ML IJ SOLN
INTRAMUSCULAR | Status: AC
  Filled 2017-10-18: qty 1

## 2017-10-18 MED ORDER — FAMOTIDINE 20 MG PO TABS
ORAL_TABLET | ORAL | Status: AC
  Administered 2017-10-18: 20 mg via ORAL
  Filled 2017-10-18: qty 1

## 2017-10-18 SURGICAL SUPPLY — 47 items
BAG URINE DRAINAGE (UROLOGICAL SUPPLIES) ×2 IMPLANT
BLADE CLIPPER SURG (BLADE) ×2 IMPLANT
BLADE SURG 15 STRL LF DISP TIS (BLADE) ×1 IMPLANT
BLADE SURG 15 STRL SS (BLADE) ×1
BLADE SURG SZ10 CARB STEEL (BLADE) ×2 IMPLANT
CANISTER SUCT 1200ML W/VALVE (MISCELLANEOUS) ×2 IMPLANT
CATH FOLEY 2WAY SLVR  5CC 14FR (CATHETERS)
CATH FOLEY 2WAY SLVR 5CC 14FR (CATHETERS) IMPLANT
CATH FOLEY SIL 2WAY 14FR5CC (CATHETERS) ×2 IMPLANT
COVER MAYO STAND STRL (DRAPES) ×2 IMPLANT
DERMABOND ADVANCED (GAUZE/BANDAGES/DRESSINGS) ×1
DERMABOND ADVANCED .7 DNX12 (GAUZE/BANDAGES/DRESSINGS) ×1 IMPLANT
DRAPE LAP W/FLUID (DRAPES) ×2 IMPLANT
DRAPE UNDER BUTTOCK W/FLU (DRAPES) ×2 IMPLANT
ELECT REM PT RETURN 9FT ADLT (ELECTROSURGICAL) ×2
ELECTRODE REM PT RTRN 9FT ADLT (ELECTROSURGICAL) ×1 IMPLANT
GLOVE BIO SURGEON STRL SZ7.5 (GLOVE) ×2 IMPLANT
GOWN STRL REUS W/ TWL LRG LVL3 (GOWN DISPOSABLE) ×2 IMPLANT
GOWN STRL REUS W/ TWL XL LVL3 (GOWN DISPOSABLE) ×1 IMPLANT
GOWN STRL REUS W/TWL LRG LVL3 (GOWN DISPOSABLE) ×2
GOWN STRL REUS W/TWL XL LVL3 (GOWN DISPOSABLE) ×1
HOLDER FOLEY CATH W/STRAP (MISCELLANEOUS) ×2 IMPLANT
KIT RM TURNOVER STRD PROC AR (KITS) ×2 IMPLANT
NEEDLE HYPO 22GX1.5 SAFETY (NEEDLE) ×2 IMPLANT
PACK BASIN MINOR ARMC (MISCELLANEOUS) ×2 IMPLANT
PENCIL ELECTRO HAND CTR (MISCELLANEOUS) ×2 IMPLANT
PLUG CATH AND CAP STER (CATHETERS) ×2 IMPLANT
RETRACTOR STERILE 25.8CMX11.3 (INSTRUMENTS) ×2 IMPLANT
RING RETRACTOR 18.6X8.9 3309G (MISCELLANEOUS) ×2 IMPLANT
RING RETRACTOR 28.3X18.3 3308G (MISCELLANEOUS) ×2 IMPLANT
SET CYSTO W/LG BORE CLAMP LF (SET/KITS/TRAYS/PACK) ×2 IMPLANT
SET YANKAUER POOLE SUCT (MISCELLANEOUS) ×2 IMPLANT
SLING SUPRIS RETROPUBIC KIT (Miscellaneous) ×2 IMPLANT
SPONGE XRAY 4X4 16PLY STRL (MISCELLANEOUS) ×2 IMPLANT
SURGILUBE 2OZ TUBE FLIPTOP (MISCELLANEOUS) ×2 IMPLANT
SUT VIC AB 2-0 CT1 27 (SUTURE) ×2
SUT VIC AB 2-0 CT1 TAPERPNT 27 (SUTURE) ×2 IMPLANT
SUT VIC AB 2-0 SH 27 (SUTURE) ×1
SUT VIC AB 2-0 SH 27XBRD (SUTURE) ×1 IMPLANT
SUT VIC AB 4-0 PS2 18 (SUTURE) ×2 IMPLANT
SYR 10ML LL (SYRINGE) ×2 IMPLANT
SYR BULB IRRIG 60ML STRL (SYRINGE) ×2 IMPLANT
SYR CONTROL 10ML (SYRINGE) ×2 IMPLANT
TRAY PREP VAG/GEN (MISCELLANEOUS) ×2 IMPLANT
TUBING CONNECTING 10 (TUBING) ×6 IMPLANT
WATER STERILE IRR 1000ML POUR (IV SOLUTION) IMPLANT
WATER STERILE IRR 3000ML UROMA (IV SOLUTION) ×2 IMPLANT

## 2017-10-18 NOTE — Interval H&P Note (Signed)
History and Physical Interval Note:  10/18/2017 11:42 AM  [name]  has presented today for surgery, with the diagnosis of stress incontinence  The various methods of treatment have been discussed with the patient and family. After consideration of risks, benefits and other options for treatment, the patient has consented to  Procedure(s): PUBO-VAGINAL SLING (N/Sullivan) as Sullivan surgical intervention .  The patient's history has been reviewed, patient examined, no change in status, stable for surgery.  I have reviewed the patient's chart and labs.  Questions were answered to the patient's satisfaction.     Kara Sullivan   

## 2017-10-18 NOTE — Interval H&P Note (Signed)
History and Physical Interval Note:  10/18/2017 11:42 AM  Kara Sullivan  has presented today for surgery, with the diagnosis of stress incontinence  The various methods of treatment have been discussed with the patient and family. After consideration of risks, benefits and other options for treatment, the patient has consented to  Procedure(s): PUBO-VAGINAL SLING (N/A) as a surgical intervention .  The patient's history has been reviewed, patient examined, no change in status, stable for surgery.  I have reviewed the patient's chart and labs.  Questions were answered to the patient's satisfaction.     Shanin Szymanowski A

## 2017-10-18 NOTE — OR Nursing (Signed)
Dr. Sherron MondayMacdiarmid notified via tele @ 1500, advises pt is to measure post void amts with I/O cath (has been doing at home) and nurse from his office will call to check on her tomorrow.

## 2017-10-18 NOTE — OR Nursing (Signed)
2nd postop void just prior to d/c 350cc

## 2017-10-18 NOTE — Discharge Instructions (Addendum)
I have reviewed discharge instructions in detail with the patient. They will follow-up with me or their physician as scheduled. My nurse will also be calling the patients as per protocol.   Per Dr. Sherron MondayMacDiarmid, measure post void amount with I/O cath and document.  Office nurse will call you tomorrow.    AMBULATORY SURGERY  DISCHARGE INSTRUCTIONS   1) The drugs that you were given will stay in your system until tomorrow so for the next 24 hours you should not:  A) Drive an automobile B) Make any legal decisions C) Drink any alcoholic beverage   2) You may resume regular meals tomorrow.  Today it is better to start with liquids and gradually work up to solid foods.  You may eat anything you prefer, but it is better to start with liquids, then soup and crackers, and gradually work up to solid foods.   3) Please notify your doctor immediately if you have any unusual bleeding, trouble breathing, redness and pain at the surgery site, drainage, fever, or pain not relieved by medication.    4) Additional Instructions:        Please contact your physician with any problems or Same Day Surgery at [phone], Monday through Friday 6 am to 4 pm, or [place] at [facility] Main number at [phone].

## 2017-10-18 NOTE — Anesthesia Procedure Notes (Signed)
Procedure Name: LMA Insertion Performed by: Fenix Rorke, CRNA Pre-anesthesia Checklist: Patient identified, Patient being monitored, Timeout performed, Emergency Drugs available and Suction available Patient Re-evaluated:Patient Re-evaluated prior to induction Oxygen Delivery Method: Circle system utilized Preoxygenation: Pre-oxygenation with 100% oxygen Induction Type: IV induction Ventilation: Mask ventilation without difficulty LMA: LMA inserted LMA Size: 3.5 Tube type: Oral Number of attempts: 1 Placement Confirmation: positive ETCO2 and breath sounds checked- equal and bilateral Tube secured with: Tape Dental Injury: Teeth and Oropharynx as per pre-operative assessment        

## 2017-10-18 NOTE — OR Nursing (Signed)
Pr Dr. Sherron MondayMacDiarmid, pt can resume asprin in 5 days.  Pt and daughter notified during d/c instructions.

## 2017-10-18 NOTE — Op Note (Signed)
Preoperative diagnosis: Stress urinary incontinence Postoperative diagnosis: Stress urinary incontinence with bladder overactivity Surgery: Sling cystourethropexy and cystoscopy Surgeon: Dr. Lorin Picket Wynette [place]  Patient has the above diagnosis and consented to the above procedure.  Extra care was taken with leg positioning to minimize the risk of compartment syndrome and neuropathy and deep vein thrombosis.  She did have some bladder descensus at rest with mild shortening of the urethra and a small cystourethrocele.  Preoperative antibiotics were given  I made 2 1 cm incisions 1 fingerbreadth above the symphysis pubis 1.5 cm lateral to the midline.  I made a 2 cm suburethral incision after instilling 3 cc of lidocaine epinephrine mixture.  The incision was then made to appropriate depth.  I mobilized sharply and bluntly to the urethrovesical angle bilaterally.  She did have some shortening of the urethra and almost a buckling of the proximal urethra from the mild cystourethrocele  With the bladder emptied I passed a trocar on the top of and along the back of the symphysis pubis onto the pulp of my index finger bilaterally.  I was very cognizant to make certain that abdominal breathing was minimal and the belly was quiet.  I stayed lateral and used my usual box technique.  I thought on the patient's left side I was a little bit close to the bladder but never entered the bladder but I thought it was best to repeat the exact technique and with the bladder emptied I removed the trocar and repassed it and double checked it.  There was no injury to the bladder.  There was no movement of the bladder with movement of the trochars.  This was triple checked because she was having some bladder spasms as well.  There was excellent efflux bilaterally of dye but the ureteral orifices were very close to the bladder neck at 5 and 7:00 likely from previous surgery.  It actually took a few moments to identify the efflux.   There was no injury to the urethra cystoscopically and visually  With the bladder emptied I attached the mesh with the described technique and brought up through the retropubic space.  I tensioned it over the fat part of a moderate size Kelly clamp.  I was very pleased with the position of the mesh.  The mesh pretty much was underneath her entire urethra and encroaching on her bladder neck because urethra was relatively short.  The mesh had appropriate hypermobility.  It had no spring back effect.  Recognizing the bladder cystocele I adjusted the tension appropriately to minimize the risk of retention and obstruction at the level of the bladder neck.  I was very pleased with the tension but as planned one could argue that it was within one or two millimeters tighter than I usually do but well within normal limits.  Again I was very happy with the position and tension.  After irrigating I closed the anterior vaginal wall with running 2-0 Vicryl followed by 2 interrupted sutures.  I cut the mesh below the skin level and closed the 2 abdominal incisions with interrupted 4-0 Vicryl and Dermabond.  Vaginal pack was applied and Foley catheter was left in place  I was a little bit concerned about the patient's bladder spasms during the case and that they would mildly inhibit my ability sometimes to fill the bladder.  Having said that she was not leaking a lot around the cystoscope.  Overall I was very pleased with the sling and hopefully it will greatly help  her urinary incontinence and reach her treatment goal.  Blood loss was less than 50 mL.  Leg position was good.  Urine output was good

## 2017-10-18 NOTE — Anesthesia Preprocedure Evaluation (Signed)
Anesthesia Evaluation  Patient identified by MRN, date of birth, ID band Patient awake    Reviewed: Allergy & Precautions, NPO status , Patient's Chart, lab work & pertinent test results, reviewed documented beta blocker date and time   Airway Mallampati: II  TM Distance: >3 FB     Dental  (+) Chipped   Pulmonary           Cardiovascular hypertension, Pt. on medications      Neuro/Psych    GI/Hepatic hiatal hernia,   Endo/Other  diabetes, Type 2  Renal/GU      Musculoskeletal   Abdominal   Peds  Hematology   Anesthesia Other Findings Hypertensive.  Reproductive/Obstetrics                             Anesthesia Physical Anesthesia Plan  ASA: III  Anesthesia Plan: General   Post-op Pain Management:    Induction: Intravenous  PONV Risk Score and Plan:   Airway Management Planned: LMA  Additional Equipment:   Intra-op Plan:   Post-operative Plan:   Informed Consent: I have reviewed the patients History and Physical, chart, labs and discussed the procedure including the risks, benefits and alternatives for the proposed anesthesia with the patient or authorized representative who has indicated his/her understanding and acceptance.     Plan Discussed with: CRNA  Anesthesia Plan Comments:         Anesthesia Quick Evaluation

## 2017-10-18 NOTE — Transfer of Care (Signed)
Immediate Anesthesia Transfer of Care Note  Patient: Richelle ItoFrancis E Dannemiller  Procedure(s) Performed: Procedure(s): PUBO-VAGINAL SLING (N/A)  Patient Location: PACU  Anesthesia Type:General  Level of Consciousness: sedated  Airway & Oxygen Therapy: Patient Spontanous Breathing and Patient connected to face mask oxygen  Post-op Assessment: Report given to RN and Post -op Vital signs reviewed and stable  Post vital signs: Reviewed and stable  Last Vitals:  Vitals:   10/18/17 1005 10/18/17 1327  BP: (!) 195/78 (!) 157/72  Pulse: 78 86  Resp: 18 14  Temp: 36.6 C 37.2 C  SpO2: 98% 100%    Complications: No apparent anesthesia complications

## 2017-10-18 NOTE — Anesthesia Post-op Follow-up Note (Signed)
Anesthesia QCDR form completed.        

## 2017-10-18 NOTE — H&P (Signed)
Chief Complaint  Patient presents with  . Urinary Incontinence    HPI: The patient is approximately 10 weeks since an anterior and posterior repair and repair of enterocele. It appears the patient started having stress incontinence after surgery. The patient had been given the beta 3 agonist. What I reviewed the medical records she may have just had a repair of enterocele and no rectocele.  Prior to surgery by history she had mild stress and urge incontinence. She was wearing one to 2 liners a day that were mildly wet or were present for safety. She had no bedwetting  Currently she leaks with coughing sneezing bending and standing. She can leak without awareness even with sitting. She does believe it is urethral leaking. She can have urgency incontinence but she is not having a lot of her urgency. She has little to no bedwetting. The beta 3 agonists was started after surgery and she was not treated with medication prior. She is wearing at least 8 pads a day that can be soaked and she utilizes a double pad system  She is voiding every 2 or 3 hours and hourly during the day and she cannot hold urination for 2 hours.  The patient has mixed incontinence but significant leakage not associated with awareness since an anterior repair and repair of an enterocele. She had mild mixed incontinence prior. She has nocturia and hourly frequency. The patient likely has unmasked her stress incontinence with her prolapse surgery. The role of urodynamics and cystoscopy discussed. I agree that based upon today's findings likely a sling cystourethropexy will be offered to help her incontinence.  Today Incontinence and frequency are stable On urodynamics the patient 30 minutes after voiding was catheterized for 100 mL. Bladder capacity was 190 mL. Bladder was unstable reaching a pressure of 6 cmH2O associated with severe leakage. They were provoked by coughing. At 100 mL her Valsalva leak point pressure was  37 cm of water associated with moderate leakage. I do 125 mL it was 17 cm of water. She was triggering. During voluntary voiding she voided 150 mL with Noxafil 4 mils per second. Maximum voiding pressure 6 cm of water. Residual 75 mL. EMG activity increased during the voiding phase. Bladder was very hypersensitive. She had some funneling of the bladder neck. Details of the urodynamics are signed and dictated  The patient has significant stress incontinence urodynamically but has impressive overactive bladder with small capacity. Postoperative urge or worsening incontinence with a sling would need to be discussed. I would like to give her a trial of antimuscarinics and only had Toviaz samples. Reassess in about a month. Discussed the sling then. Try 1 more antimuscarinic prior to surgery would be very reasonable.  Reassess in 1 month on Toviaz 4 mg to help. Again I was very polite and I can empathize with the patient but I will not change my treatment algorithm especially with a small spastic bladder. She is at much higher risk of not doing well with surgery. I will schedule the surgery quickly when I see her next time pending results   Clinically not infected  Today HS normal; ascultation of chest normal Incontinence and frequency are stable   PMH:     Past Medical History:  Diagnosis Date  . 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 Diabetes mellitus without complication (HCC)   . Frequent urination 04/16/2016  . High cholesterol   . Hypertension   . Stress incontinence 04/16/2016  . Vaginal atrophy 04/16/2016    Surgical History:  Past Surgical History:  Procedure Laterality Date  . ABDOMINAL HYSTERECTOMY    . BLADDER SUSPENSION  2018  . CARPAL TUNNEL RELEASE Right   . CYSTOCELE REPAIR N/A 04/19/2017   Procedure: ANTERIOR COLPORRHAPY WITH KELLY PLICATION AND ENTEROCELE LIGATION;  Surgeon: Herold Harmsefrancesco, Martin A, MD;  Location: [facility];  Service: Gynecology;  Laterality:  N/A;  . OOPHORECTOMY      Home Medications:      Allergies as of 09/06/2017      Reactions   Atorvastatin Other (See Comments)   Ciprofloxacin Swelling   Rosuvastatin Other (See Comments)   Elevated liver enzymes   Sulfa Antibiotics Nausea And Vomiting                  Medication List         Accurate as of 09/06/17  3:07 PM. Always use your most recent med list.           aspirin EC 81 MG tablet Take 81 mg by mouth daily.   calcium-vitamin D 250-125 MG-UNIT tablet Commonly known as:  OSCAL WITH D Take 1 tablet by mouth daily.   Fish Oil 1000 MG Caps Take 1,000 mg by mouth daily.   lisinopril-hydrochlorothiazide 20-12.5 MG tablet Commonly known as:  PRINZIDE,ZESTORETIC Take 1 tablet by mouth daily.   metFORMIN 500 MG 24 hr tablet Commonly known as:  GLUCOPHAGE-XR Take 500 mg by mouth daily with supper.   multivitamin with minerals Tabs tablet Take 1 tablet by mouth daily. For Women   TOVIAZ 4 MG Tb24 tablet Generic drug:  fesoterodine Take 4 mg by mouth daily.   triamcinolone cream 0.1 % Commonly known as:  KENALOG Apply 1 application topically daily as needed (rash).   ZETIA 10 MG tablet Generic drug:  ezetimibe Take 10 mg by mouth daily.       Allergies:       Allergies  Allergen Reactions  . Atorvastatin Other (See Comments)  . Ciprofloxacin Swelling  . Rosuvastatin Other (See Comments)    Elevated liver enzymes  . Sulfa Antibiotics Nausea And Vomiting    Family History:      Family History  Problem Relation Age of Onset  . Breast cancer Maternal Aunt [id number]  . Heart attack Father   . Diabetes Neg Hx   . Colon cancer Neg Hx   . Ovarian cancer Neg Hx     Social History:  reports that  has never smoked. she has never used smokeless tobacco. She reports that she does not drink alcohol or use drugs.  ROS:              Physical Exam: BP (!) 184/74   Pulse 92   Ht 5\' 4"   (1.626 m)   Wt 160 lb 11.2 oz (72.9 kg)   BMI 27.58 kg/m   Constitutional:  Alert and oriented, No acute distress.  Laboratory Data: RecentLabs       Lab Results  Component Value Date   WBC 12.1 (H) 10/01/2013   HGB 9.4 (L) 04/20/2017   HCT 34.0 (L) 04/19/2017   MCV 90 10/01/2013   PLT 464 (H) 10/01/2013      RecentLabs       Lab Results  Component Value Date   CREATININE 1.16 10/01/2013      RecentLabs  No results found for: PSA    RecentLabs  No results found for: TESTOSTERONE    RecentLabs  No results found for: HGBA1C    Urinalysis Labs(Brief)  Component Value Date/Time   COLORURINE Straw 10/01/2013 2057   APPEARANCEUR Clear 06/29/2017 1002   LABSPEC 1.011 10/01/2013 2057   PHURINE 5.0 10/01/2013 2057   GLUCOSEU Negative 06/29/2017 1002   GLUCOSEU Negative 10/01/2013 2057   HGBUR Negative 10/01/2013 2057   BILIRUBINUR Negative 06/29/2017 1002   BILIRUBINUR Negative 10/01/2013 2057   KETONESUR Negative 10/01/2013 2057   PROTEINUR Negative 06/29/2017 1002   PROTEINUR Negative 10/01/2013 2057   UROBILINOGEN 0.2 06/01/2017 1049   NITRITE Negative 06/29/2017 1002   NITRITE Negative 10/01/2013 2057   LEUKOCYTESUR Negative 06/29/2017 1002   LEUKOCYTESUR Negative 10/01/2013 2057      Pertinent Imaging: None  Assessment & Plan: I drew the patient a picture.  I discussed watchful waiting in a sling in detail with my usual template.  I made it very clear that her overactive bladder could persist in about 40% of cases and worsened in a few percent.  She understands that she has a very significant overactive bladder on urodynamics.  She understands that she may not be feeling urgency now because of her significant stress incontinence.  We talked about the timing relative to surgery for both her stress and urge incontinence and how they may have been unmasked or worsened with prolapse surgery.  She does not want  to try Vesicare or generic pill.  I did mention the 3 refractory therapies when her daughter asked me what could be done if the sling does not address the overactive bladder component.  We will proceed with surgery as soon as possible as promised.    After a thorough review of the management options for the patient's condition the patient  elected to proceed with surgical therapy as noted above. We have discussed the potential benefits and risks of the procedure, side effects of the proposed treatment, the likelihood of the patient achieving the goals of the procedure, and any potential problems that might occur during the procedure or recuperation. Informed consent has been obtained.

## 2017-10-18 NOTE — Anesthesia Postprocedure Evaluation (Signed)
Anesthesia Post Note  Patient: Kara Sullivan  Procedure(s) Performed: Leonides GrillsPUBO-VAGINAL SLING (N/A )  Patient location during evaluation: PACU Anesthesia Type: General Level of consciousness: awake and alert Pain management: pain level controlled Vital Signs Assessment: post-procedure vital signs reviewed and stable Respiratory status: spontaneous breathing, nonlabored ventilation, respiratory function stable and patient connected to nasal cannula oxygen Cardiovascular status: blood pressure returned to baseline and stable Postop Assessment: no apparent nausea or vomiting Anesthetic complications: no     Last Vitals:  Vitals:   10/18/17 1405 10/18/17 1415  BP:  (!) 165/82  Pulse: 88 89  Resp: 14 16  Temp: (!) 36.3 C (!) 36.2 C  SpO2: 98% 97%    Last Pain:  Vitals:   10/18/17 1415  TempSrc: Temporal  PainSc: 5                  Attikus Bartoszek S

## 2017-10-18 NOTE — OR Nursing (Signed)
Pt voided 250 ml cloudy yellow urine, post void bladder scan = 287 ml.  Nicholes RoughSusan Mann RN is calling Sr. MacDiamid with these results.

## 2017-11-01 ENCOUNTER — Ambulatory Visit: Payer: Medicare Other | Admitting: Urology

## 2017-11-01 ENCOUNTER — Encounter: Payer: Self-pay | Admitting: Urology

## 2017-11-01 VITALS — BP 179/91 | HR 76 | Ht 64.0 in | Wt 162.0 lb

## 2017-11-01 DIAGNOSIS — N393 Stress incontinence (female) (male): Secondary | ICD-10-CM

## 2017-11-01 LAB — URINALYSIS, COMPLETE
BILIRUBIN UA: NEGATIVE
Glucose, UA: NEGATIVE
KETONES UA: NEGATIVE
Nitrite, UA: NEGATIVE
PROTEIN UA: NEGATIVE
RBC UA: NEGATIVE
SPEC GRAV UA: 1.015 (ref 1.005–1.030)
Urobilinogen, Ur: 0.2 mg/dL (ref 0.2–1.0)
pH, UA: 6.5 (ref 5.0–7.5)

## 2017-11-01 LAB — MICROSCOPIC EXAMINATION

## 2017-11-01 NOTE — Progress Notes (Signed)
11/01/2017 10:39 AM   Kara Sullivan 09/14/1941 [id number]  Referring provider: Kandyce RudBabaoff, Marcus, MD [phone] [address] [facility] [place] [facility] [place], [place] [id number]  Chief Complaint  Patient presents with  . Routine Post Op    HPI: The patient has mixed incontinence but significant leakage not associated with awareness since an anterior repair and repair of an enterocele. She had mild mixed incontinence prior. She has nocturia and hourly frequency. The patient likely has unmasked her stress incontinence with her prolapse surgery. The role of urodynamics and cystoscopy discussed. I agree that based upon today's findings likely a sling cystourethropexy will be offered to help her incontinence.   I dictated along note last time and reviewed my medical records.  She had an impressive overactive bladder prior to surgery and had failed Toviaz.  I was concerned about her overactive bladder persisting or worsening after surgery.  She understood that she may not be feeling urgency because of her significant stress incontinence.  And that both may have been unmasked by the prolapse repair.  She did not want to try Vesicare or a generic medication and I did mention the 3 refractory therapies.  She had failed Myrbetriq with the other provider  Intraoperatively she had a little bit of shortening of her urethra and almost buckling of the proximal urethra from the mild cystourethrocele.  I tensioned the sling appropriately in that area and one could argue 1-2 mm tighter underneath the mid urethra but within my normal range.  She was having bladder spasms during the case.  The patient is dry and is very pleased.  She is noticed minimal pink when she wipes.  Her 3 residuals post procedure were low.  Her flow was good.  She is not having pain.  On pelvic examination her cystocele was less and approximately grade 1.  Incision line healing well.  Appropriate hypermobility of the  bladder neck are negative cough test.  PMH: Past Medical History:  Diagnosis Date  . 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 Diabetes mellitus without complication (HCC)   . Frequent urination 04/16/2016  . High cholesterol   . History of hiatal hernia   . Hypertension   . Stress incontinence 04/16/2016  . Vaginal atrophy 04/16/2016    Surgical History: Past Surgical History:  Procedure Laterality Date  . ABDOMINAL HYSTERECTOMY    . BLADDER SUSPENSION  2018  . CARPAL TUNNEL RELEASE Right   . CYSTOCELE REPAIR N/A 04/19/2017   Procedure: ANTERIOR COLPORRHAPY WITH KELLY PLICATION AND ENTEROCELE LIGATION;  Surgeon: Herold Harmsefrancesco, Martin A, MD;  Location: [facility];  Service: Gynecology;  Laterality: N/A;  . OOPHORECTOMY    . PUBOVAGINAL SLING N/A 10/18/2017   Procedure: Leonides GrillsPUBO-VAGINAL SLING;  Surgeon: Alfredo MartinezMacDiarmid, Artelia Game, MD;  Location: [facility];  Service: Urology;  Laterality: N/A;    Home Medications:  Allergies as of 11/01/2017      Reactions   Bee Venom Anaphylaxis   Atorvastatin Other (See Comments)   Body and joint aches   Ciprofloxacin Swelling   Rosuvastatin Other (See Comments)   Elevated liver enzymes   Sulfa Antibiotics Nausea And Vomiting      Medication List        Accurate as of 11/01/17 10:39 AM. Always use your most recent med list.          calcium-vitamin D 250-125 MG-UNIT tablet Commonly known as:  OSCAL WITH D Take 1 tablet by mouth daily.   Fish Oil 1000 MG Caps  Take 1,000 mg by mouth daily.   lisinopril-hydrochlorothiazide 20-12.5 MG tablet Commonly known as:  PRINZIDE,ZESTORETIC Take 1 tablet by mouth daily.   metFORMIN 500 MG 24 hr tablet Commonly known as:  GLUCOPHAGE-XR Take 500 mg by mouth daily with supper.   multivitamin with minerals Tabs tablet Take 1 tablet by mouth daily. For Women   triamcinolone cream 0.1 % Commonly known as:  KENALOG Apply 1 application topically daily as needed (rash).   ZETIA 10 MG tablet Generic drug:  ezetimibe Take 10 mg by  mouth daily.       Allergies:  Allergies  Allergen Reactions  . Bee Venom Anaphylaxis  . Atorvastatin Other (See Comments)    Body and joint aches  . Ciprofloxacin Swelling  . Rosuvastatin Other (See Comments)    Elevated liver enzymes  . Sulfa Antibiotics Nausea And Vomiting    Family History: Family History  Problem Relation Age of Onset  . Breast cancer Maternal Aunt 79  . Heart attack Father   . Diabetes Neg Hx   . Colon cancer Neg Hx   . Ovarian cancer Neg Hx     Social History:  reports that  has never smoked. she has never used smokeless tobacco. She reports that she does not drink alcohol or use drugs.  ROS: UROLOGY Frequent Urination?: No Hard to postpone urination?: No Burning/pain with urination?: No Get up at night to urinate?: Yes Leakage of urine?: No Urine stream starts and stops?: No Trouble starting stream?: No Do you have to strain to urinate?: No Blood in urine?: No Urinary tract infection?: No Sexually transmitted disease?: No Injury to kidneys or bladder?: No Painful intercourse?: No Weak stream?: No Currently pregnant?: No Vaginal bleeding?: Yes Last menstrual period?: n  Gastrointestinal Nausea?: No Vomiting?: No Indigestion/heartburn?: No Diarrhea?: No Constipation?: No  Constitutional Fever: No Night sweats?: No Weight loss?: No Fatigue?: No  Skin Skin rash/lesions?: No Itching?: No  Eyes Blurred vision?: No Double vision?: No  Ears/Nose/Throat Sore throat?: No Sinus problems?: No  Hematologic/Lymphatic Swollen glands?: No Easy bruising?: No  Cardiovascular Leg swelling?: No Chest pain?: No  Respiratory Cough?: No Shortness of breath?: No  Endocrine Excessive thirst?: No  Musculoskeletal Back pain?: No Joint pain?: No  Neurological Headaches?: No Dizziness?: No  Psychologic Depression?: No Anxiety?: No  Physical Exam: BP (!) 179/91   Pulse 76   Ht 5\' 4"  (1.626 m)   Wt 162 lb (73.5 kg)    BMI 27.81 kg/m   Constitutional:  Alert and oriented, No acute distress.   Laboratory Data: Lab Results  Component Value Date   WBC 11.9 (H) 10/13/2017   HGB 11.4 (L) 10/13/2017   HCT 33.4 (L) 10/13/2017   MCV 93.1 10/13/2017   PLT 517 (H) 10/13/2017    Lab Results  Component Value Date   CREATININE 1.07 (H) 10/13/2017    No results found for: PSA  No results found for: TESTOSTERONE  No results found for: HGBA1C  Urinalysis    Component Value Date/Time   COLORURINE Straw 10/01/2013 2057   APPEARANCEUR Clear 06/29/2017 1002   LABSPEC 1.011 10/01/2013 2057   PHURINE 5.0 10/01/2013 2057   GLUCOSEU Negative 06/29/2017 1002   GLUCOSEU Negative 10/01/2013 2057   HGBUR Negative 10/01/2013 2057   BILIRUBINUR Negative 06/29/2017 1002   BILIRUBINUR Negative 10/01/2013 2057   KETONESUR Negative 10/01/2013 2057   PROTEINUR Negative 06/29/2017 1002   PROTEINUR Negative 10/01/2013 2057   UROBILINOGEN 0.2 06/01/2017 1049   NITRITE  Negative 06/29/2017 1002   NITRITE Negative 10/01/2013 2057   LEUKOCYTESUR Negative 06/29/2017 1002   LEUKOCYTESUR Negative 10/01/2013 2057    Pertinent Imaging: none  Assessment & Plan: The patient and I are both delighted.  She wears 1 light pad and intermittently noticed a little bit of drainage and sometimes a bit of pink in my opinion it from the incision.  I will reassess in 10 weeks.  There are no diagnoses linked to this encounter.  No Follow-up on file.  Martina Sinner, MD  [facility] [address], [address] [place], [place] [address] [phone]

## 2017-11-04 LAB — CULTURE, URINE COMPREHENSIVE

## 2017-11-08 MED ORDER — NITROFURANTOIN MACROCRYSTAL 100 MG PO CAPS: 100.0000 mg | ORAL_CAPSULE | Freq: Two times a day (BID) | ORAL | 0 refills | Status: AC

## 2017-11-08 NOTE — Addendum Note (Signed)
Addended by: Honor LohGARRISON, Teleah Villamar M on: 11/08/2017 08:48 AM   Modules accepted: Orders

## 2017-11-22 ENCOUNTER — Encounter: Payer: Self-pay | Admitting: Urology

## 2017-11-29 ENCOUNTER — Other Ambulatory Visit: Payer: Self-pay | Admitting: Family Medicine

## 2017-11-29 DIAGNOSIS — Z1231 Encounter for screening mammogram for malignant neoplasm of breast: Secondary | ICD-10-CM

## 2018-01-07 ENCOUNTER — Ambulatory Visit
Admission: RE | Admit: 2018-01-07 | Discharge: 2018-01-07 | Disposition: A | Discharge status: Home / Self Care | Payer: Medicare Other | Source: Ambulatory Visit | Attending: Family Medicine | Admitting: Family Medicine

## 2018-01-07 DIAGNOSIS — Z1231 Encounter for screening mammogram for malignant neoplasm of breast: Secondary | ICD-10-CM | POA: Diagnosis present

## 2018-01-10 ENCOUNTER — Ambulatory Visit: Payer: Medicare Other | Admitting: Urology

## 2018-01-10 ENCOUNTER — Encounter: Payer: Self-pay | Admitting: Urology

## 2018-01-10 VITALS — BP 160/78 | HR 81 | Resp 16 | Ht 65.0 in | Wt 161.3 lb

## 2018-01-10 DIAGNOSIS — N3946 Mixed incontinence: Secondary | ICD-10-CM

## 2018-01-10 NOTE — Progress Notes (Signed)
01/10/2018 10:20 AM   Richelle Ito [birth year] [id number]  Referring provider: Kandyce Rud, MD [phone] [address] [facility] [place], [place] [address]  No chief complaint on file.   HPI: The patient has mixed incontinence but significant leakage not associated with awareness since an anterior repair and repair of an enterocele. She had mild mixed incontinence prior. She has nocturia and hourly frequency. The patient likely has unmasked her stress incontinence with her prolapse surgery. The role of urodynamics and cystoscopy discussed. I agree that based upon today's findings likely a sling cystourethropexy will be offered to help her incontinence.   I dictated along note last time and reviewed my medical records.  She had an impressive overactive bladder prior to surgery and had failed Toviaz.  I was concerned about her overactive bladder persisting or worsening after surgery.  She understood that she may not be feeling urgency because of her significant stress incontinence.  And that both may have been unmasked by the prolapse repair.  She did not want to try Vesicare or a generic medication and I did mention the 3 refractory therapies.  She had failed Myrbetriq with the other provider  Intraoperatively she had a little bit of shortening of her urethra and almost buckling of the proximal urethra from the mild cystourethrocele.  I tensioned the sling appropriately in that area and one could argue 1-2 mm tighter underneath the mid urethra but within my normal range.  She was having bladder spasms during the case.  The patient is dry and is very pleased.  She is noticed minimal pink when she wipes.  Her 3 residuals post procedure were low.  Her flow was good.  She is not having pain.  On pelvic examination her cystocele was less and approximately grade 1.  Incision line healing well.  Appropriate hypermobility of the bladder neck are negative cough  test.The patient and I are both delighted.  She wears 1 light pad and intermittently noticed a little bit of drainage and sometimes a bit of pink in my opinion it from the incision.  I will reassess in 10 weeks.  Today Frequency is stable.  Patient is continent.  She is very pleased.  She wears 1 small liner for light discharge.  On pelvic examination she had no stress incontinence with appropriate hypermobility of the short urethra.  She had a grade 1 cystocele but no true hinge effect.  PMH: Past Medical History:  Diagnosis Date  . 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 Diabetes mellitus without complication (HCC)   . Frequent urination 04/16/2016  . High cholesterol   . History of hiatal hernia   . Hypertension   . Stress incontinence 04/16/2016  . Vaginal atrophy 04/16/2016    Surgical History: Past Surgical History:  Procedure Laterality Date  . ABDOMINAL HYSTERECTOMY    . BLADDER SUSPENSION  2018  . CARPAL TUNNEL RELEASE Right   . CYSTOCELE REPAIR N/A 04/19/2017   Procedure: ANTERIOR COLPORRHAPY WITH KELLY PLICATION AND ENTEROCELE LIGATION;  Surgeon: Herold Harms, MD;  Location: [facility] OR[facility];  Service: Gynecology;  Laterality: N/A;  . OOPHORECTOMY    . PUBOVAGINAL SLING N/A 10/18/2017   Procedure: Leonides Grills;  Surgeon: Alfredo Martinez, MD;  Location: [facility] ORS;  Service: Urology;  Laterality: N/A;    Home Medications:  Allergies as of 01/10/2018      Reactions   Bee Venom Anaphylaxis   Atorvastatin Other (See Comments)   Body and joint  aches   Ciprofloxacin Swelling   Rosuvastatin Other (See Comments)   Elevated liver enzymes   Sulfa Antibiotics Nausea And Vomiting      Medication List        Accurate as of 01/10/18 10:20 AM. Always use your most recent med list.          calcium-vitamin D 250-125 MG-UNIT tablet Commonly known as:  OSCAL WITH D Take 1 tablet by mouth daily.   Fish Oil 1000 MG Caps Take 1,000 mg by mouth daily.   lisinopril-hydrochlorothiazide 20-12.5  MG tablet Commonly known as:  PRINZIDE,ZESTORETIC Take 1 tablet by mouth daily.   metFORMIN 500 MG 24 hr tablet Commonly known as:  GLUCOPHAGE-XR Take 500 mg by mouth daily with supper.   multivitamin with minerals Tabs tablet Take 1 tablet by mouth daily. For Women   nitrofurantoin 100 MG capsule Commonly known as:  MACRODANTIN Take 1 capsule (100 mg total) by mouth 2 (two) times daily.   triamcinolone cream 0.1 % Commonly known as:  KENALOG Apply 1 application topically daily as needed (rash).   ZETIA 10 MG tablet Generic drug:  ezetimibe Take 10 mg by mouth daily.       Allergies:  Allergies  Allergen Reactions  . Bee Venom Anaphylaxis  . Atorvastatin Other (See Comments)    Body and joint aches  . Ciprofloxacin Swelling  . Rosuvastatin Other (See Comments)    Elevated liver enzymes  . Sulfa Antibiotics Nausea And Vomiting    Family History: Family History  Problem Relation Age of Onset  . Breast cancer Maternal Aunt 33  . Heart attack Father   . Diabetes Neg Hx   . Colon cancer Neg Hx   . Ovarian cancer Neg Hx     Social History:  reports that she has never smoked. She has never used smokeless tobacco. She reports that she does not drink alcohol or use drugs.  ROS:                                        Physical Exam: There were no vitals taken for this visit.  Constitutional:  Alert and oriented, No acute distress.  Laboratory Data: Lab Results  Component Value Date   WBC 11.9 (H) 10/13/2017   HGB 11.4 (L) 10/13/2017   HCT 33.4 (L) 10/13/2017   MCV 93.1 10/13/2017   PLT 517 (H) 10/13/2017    Lab Results  Component Value Date   CREATININE 1.07 (H) 10/13/2017    No results found for: PSA  No results found for: TESTOSTERONE  No results found for: HGBA1C  Urinalysis    Component Value Date/Time   COLORURINE Straw 10/01/2013 2057   APPEARANCEUR Cloudy (A) 11/01/2017 1057   LABSPEC 1.011 10/01/2013 2057    PHURINE 5.0 10/01/2013 2057   GLUCOSEU Negative 11/01/2017 1057   GLUCOSEU Negative 10/01/2013 2057   HGBUR Negative 10/01/2013 2057   BILIRUBINUR Negative 11/01/2017 1057   BILIRUBINUR Negative 10/01/2013 2057   KETONESUR Negative 10/01/2013 2057   PROTEINUR Negative 11/01/2017 1057   PROTEINUR Negative 10/01/2013 2057   UROBILINOGEN 0.2 06/01/2017 1049   NITRITE Negative 11/01/2017 1057   NITRITE Negative 10/01/2013 2057   LEUKOCYTESUR 1+ (A) 11/01/2017 1057   LEUKOCYTESUR Negative 10/01/2013 2057    Pertinent Imaging: none  Assessment & Plan: The patient has done very well from her sling.  We are both very  pleased and I will see her as needed  There are no diagnoses linked to this encounter.  No follow-ups on file.  Martina SinnerMACDIARMID,Ayah Cozzolino A, MD  [facility] [address], [address] [place], [place] [address] [phone]

## 2018-11-18 ENCOUNTER — Other Ambulatory Visit: Payer: Self-pay | Admitting: Family Medicine

## 2018-11-18 DIAGNOSIS — Z1231 Encounter for screening mammogram for malignant neoplasm of breast: Secondary | ICD-10-CM

## 2019-04-24 ENCOUNTER — Ambulatory Visit
Admission: RE | Admit: 2019-04-24 | Discharge: 2019-04-24 | Disposition: A | Discharge status: Home / Self Care | Payer: Medicare Other | Source: Ambulatory Visit | Attending: Family Medicine | Admitting: Family Medicine

## 2019-04-24 ENCOUNTER — Other Ambulatory Visit: Payer: Self-pay

## 2019-04-24 DIAGNOSIS — Z1231 Encounter for screening mammogram for malignant neoplasm of breast: Secondary | ICD-10-CM | POA: Insufficient documentation

## 2020-02-06 ENCOUNTER — Other Ambulatory Visit: Payer: Self-pay | Admitting: Family Medicine

## 2020-02-06 DIAGNOSIS — Z1231 Encounter for screening mammogram for malignant neoplasm of breast: Secondary | ICD-10-CM

## 2020-04-24 ENCOUNTER — Ambulatory Visit
Admission: RE | Admit: 2020-04-24 | Discharge: 2020-04-24 | Disposition: A | Discharge status: Home / Self Care | Payer: Medicare Other | Source: Ambulatory Visit | Attending: Family Medicine | Admitting: Family Medicine

## 2020-04-24 DIAGNOSIS — Z1231 Encounter for screening mammogram for malignant neoplasm of breast: Secondary | ICD-10-CM

## 2021-01-08 LAB — EXTERNAL GENERIC LAB PROCEDURE: COLOGUARD: NEGATIVE

## 2021-01-21 ENCOUNTER — Other Ambulatory Visit: Payer: Self-pay | Admitting: Family Medicine

## 2021-01-21 DIAGNOSIS — Z1231 Encounter for screening mammogram for malignant neoplasm of breast: Secondary | ICD-10-CM

## 2021-04-25 ENCOUNTER — Ambulatory Visit
Admission: RE | Admit: 2021-04-25 | Discharge: 2021-04-25 | Disposition: A | Discharge status: Home / Self Care | Payer: Medicare Other | Source: Ambulatory Visit | Attending: Family Medicine | Admitting: Family Medicine

## 2021-04-25 ENCOUNTER — Other Ambulatory Visit: Payer: Self-pay

## 2021-04-25 DIAGNOSIS — Z1231 Encounter for screening mammogram for malignant neoplasm of breast: Secondary | ICD-10-CM | POA: Insufficient documentation

## 2021-11-05 ENCOUNTER — Encounter: Payer: Self-pay | Admitting: Ophthalmology

## 2021-11-13 NOTE — Discharge Instructions (Signed)

## 2021-11-17 ENCOUNTER — Other Ambulatory Visit: Payer: Self-pay

## 2021-11-17 ENCOUNTER — Ambulatory Visit
Admission: RE | Admit: 2021-11-17 | Discharge: 2021-11-17 | Disposition: A | Discharge status: Home / Self Care | Payer: Medicare Other | Attending: Ophthalmology | Admitting: Ophthalmology

## 2021-11-17 ENCOUNTER — Ambulatory Visit: Payer: Medicare Other | Admitting: Anesthesiology

## 2021-11-17 ENCOUNTER — Encounter
Admission: RE | Disposition: A | Discharge status: Home / Self Care | Payer: Self-pay | Source: Home / Self Care | Attending: Ophthalmology

## 2021-11-17 ENCOUNTER — Encounter: Payer: Self-pay | Admitting: Ophthalmology

## 2021-11-17 DIAGNOSIS — Z7984 Long term (current) use of oral hypoglycemic drugs: Secondary | ICD-10-CM | POA: Diagnosis not present

## 2021-11-17 DIAGNOSIS — H2512 Age-related nuclear cataract, left eye: Secondary | ICD-10-CM | POA: Diagnosis present

## 2021-11-17 DIAGNOSIS — I10 Essential (primary) hypertension: Secondary | ICD-10-CM | POA: Insufficient documentation

## 2021-11-17 DIAGNOSIS — E1136 Type 2 diabetes mellitus with diabetic cataract: Secondary | ICD-10-CM | POA: Diagnosis not present

## 2021-11-17 DIAGNOSIS — Z79899 Other long term (current) drug therapy: Secondary | ICD-10-CM | POA: Insufficient documentation

## 2021-11-17 HISTORY — PX: CATARACT EXTRACTION W/PHACO: SHX586

## 2021-11-17 LAB — GLUCOSE, CAPILLARY: Glucose-Capillary: 115 mg/dL — ABNORMAL HIGH (ref 70–99)

## 2021-11-17 SURGERY — PHACOEMULSIFICATION, CATARACT, WITH IOL INSERTION
Anesthesia: Monitor Anesthesia Care | Site: Eye | Laterality: Left

## 2021-11-17 MED ORDER — ACETAMINOPHEN 325 MG PO TABS: 325.0000 mg | ORAL_TABLET | Freq: Once | ORAL | Status: DC

## 2021-11-17 MED ORDER — SIGHTPATH DOSE#1 SODIUM HYALURONATE 23 MG/ML IO SOLUTION
PREFILLED_SYRINGE | INTRAOCULAR | Status: DC | PRN
  Administered 2021-11-17: 0.6 mL via INTRAOCULAR

## 2021-11-17 MED ORDER — MIDAZOLAM HCL 2 MG/2ML IJ SOLN
INTRAMUSCULAR | Status: DC | PRN
  Administered 2021-11-17: 2 mg via INTRAVENOUS

## 2021-11-17 MED ORDER — TETRACAINE HCL 0.5 % OP SOLN
1.0000 [drp] | OPHTHALMIC | Status: DC | PRN
  Administered 2021-11-17 (×3): 1 [drp] via OPHTHALMIC

## 2021-11-17 MED ORDER — CEFUROXIME OPHTHALMIC INJECTION 1 MG/0.1 ML
INJECTION | OPHTHALMIC | Status: DC | PRN
  Administered 2021-11-17: 1 mg via OPHTHALMIC

## 2021-11-17 MED ORDER — FENTANYL CITRATE (PF) 100 MCG/2ML IJ SOLN
INTRAMUSCULAR | Status: DC | PRN
  Administered 2021-11-17: 50 ug via INTRAVENOUS

## 2021-11-17 MED ORDER — SIGHTPATH DOSE#1 BSS IO SOLN
INTRAOCULAR | Status: DC | PRN
  Administered 2021-11-17: 15 mL

## 2021-11-17 MED ORDER — LIDOCAINE HCL (PF) 2 % IJ SOLN
INTRAOCULAR | Status: DC | PRN
  Administered 2021-11-17: 1 mL via INTRAOCULAR

## 2021-11-17 MED ORDER — SIGHTPATH DOSE#1 SODIUM HYALURONATE 10 MG/ML IO SOLUTION
PREFILLED_SYRINGE | INTRAOCULAR | Status: DC | PRN
  Administered 2021-11-17: 0.85 mL via INTRAOCULAR

## 2021-11-17 MED ORDER — ACETAMINOPHEN 160 MG/5ML PO SOLN: 325.0000 mg | Freq: Once | ORAL | Status: DC

## 2021-11-17 MED ORDER — LACTATED RINGERS IV SOLN: INTRAVENOUS | Status: DC

## 2021-11-17 MED ORDER — SIGHTPATH DOSE#1 BSS IO SOLN
INTRAOCULAR | Status: DC | PRN
  Administered 2021-11-17: 75 mL via OPHTHALMIC

## 2021-11-17 MED ORDER — ARMC OPHTHALMIC DILATING DROPS
1.0000 "application " | OPHTHALMIC | Status: DC | PRN
  Administered 2021-11-17 (×3): 1 via OPHTHALMIC

## 2021-11-17 SURGICAL SUPPLY — 14 items
CATARACT SUITE SIGHTPATH (MISCELLANEOUS) ×2 IMPLANT
DISSECTOR HYDRO NUCLEUS 50X22 (MISCELLANEOUS) ×2 IMPLANT
FEE CATARACT SUITE SIGHTPATH (MISCELLANEOUS) ×1 IMPLANT
GLOVE SURG GAMMEX PI TX LF 7.5 (GLOVE) ×2 IMPLANT
GLOVE SURG SYN 8.5  E (GLOVE) ×1
GLOVE SURG SYN 8.5 E (GLOVE) ×1 IMPLANT
GLOVE SURG SYN 8.5 PF PI (GLOVE) ×1 IMPLANT
LENS IOL TECNIS EYHANCE 22.0 (Intraocular Lens) ×1 IMPLANT
NDL FILTER BLUNT 18X1 1/2 (NEEDLE) ×1 IMPLANT
NEEDLE FILTER BLUNT 18X 1/2SAF (NEEDLE) ×1
NEEDLE FILTER BLUNT 18X1 1/2 (NEEDLE) ×1 IMPLANT
SYR 3ML LL SCALE MARK (SYRINGE) ×2 IMPLANT
SYR 5ML LL (SYRINGE) ×2 IMPLANT
WATER STERILE IRR 250ML POUR (IV SOLUTION) ×2 IMPLANT

## 2021-11-17 NOTE — Op Note (Signed)
OPERATIVE NOTE  Kara Sullivan [id number] 11/17/2021   PREOPERATIVE DIAGNOSIS:  Nuclear sclerotic cataract left eye.  H25.12   POSTOPERATIVE DIAGNOSIS:    Nuclear sclerotic cataract left eye.     PROCEDURE:  Phacoemusification with posterior chamber intraocular lens placement of the left eye   LENS:   Implant Name Type Inv. Item Serial No. Manufacturer Lot No. LRB No. Used Action  LENS IOL TECNIS EYHANCE 22.0 - [id number] Intraocular Lens LENS IOL TECNIS EYHANCE 22.0 [id number] [employer]  Left 1 Implanted      Procedure(s) with comments: CATARACT EXTRACTION PHACO AND INTRAOCULAR LENS PLACEMENT (IOC) LEFT DIABETIC (Left) - 6.91 00:48.0  DIB00 +22.0   ULTRASOUND TIME: 0 minutes 48 seconds.  CDE 6.91   SURGEON:  Willey Blade, MD, MPH   ANESTHESIA:  Topical with tetracaine drops augmented with 1% preservative-free intracameral lidocaine.  ESTIMATED BLOOD LOSS: <1 mL   COMPLICATIONS:  None.   DESCRIPTION OF PROCEDURE:  The patient was identified in the holding room and transported to the operating room and placed in the supine position under the operating microscope.  The left eye was identified as the operative eye and it was prepped and draped in the usual sterile ophthalmic fashion.   A 1.0 millimeter clear-corneal paracentesis was made at the 5:00 position. 0.5 ml of preservative-free 1% lidocaine with epinephrine was injected into the anterior chamber.  The anterior chamber was filled with Healon 5 viscoelastic.  A 2.4 millimeter keratome was used to make a near-clear corneal incision at the 2:00 position.  A curvilinear capsulorrhexis was made with a cystotome and capsulorrhexis forceps.  Balanced salt solution was used to hydrodissect and hydrodelineate the nucleus.   Phacoemulsification was then used in stop and chop fashion to remove the lens nucleus and epinucleus.  The remaining cortex was then removed using the irrigation and aspiration handpiece. Healon was then placed  into the capsular bag to distend it for lens placement.  A lens was then injected into the capsular bag.  The remaining viscoelastic was aspirated.   Wounds were hydrated with balanced salt solution.  The anterior chamber was inflated to a physiologic pressure with balanced salt solution.  Intracameral vigamox 0.1 mL undiltued was injected into the eye and a drop placed onto the ocular surface.  No wound leaks were noted.  The patient was taken to the recovery room in stable condition without complications of anesthesia or surgery  Willey Blade 11/17/2021, 10:55 AM

## 2021-11-17 NOTE — Anesthesia Preprocedure Evaluation (Signed)
Anesthesia Evaluation  Patient identified by MRN, date of birth, ID band Patient awake    Reviewed: Allergy & Precautions, H&P , NPO status , Patient's Chart, lab work & pertinent test results  Airway Mallampati: II  TM Distance: >3 FB Neck ROM: full    Dental no notable dental hx.    Pulmonary    Pulmonary exam normal breath sounds clear to auscultation       Cardiovascular hypertension, Normal cardiovascular exam Rhythm:regular Rate:Normal     Neuro/Psych    GI/Hepatic   Endo/Other  diabetes  Renal/GU      Musculoskeletal   Abdominal   Peds  Hematology   Anesthesia Other Findings   Reproductive/Obstetrics                             Anesthesia Physical Anesthesia Plan  ASA: 2  Anesthesia Plan: MAC   Post-op Pain Management: Minimal or no pain anticipated   Induction:   PONV Risk Score and Plan: 2 and Treatment may vary due to age or medical condition, TIVA and Midazolam  Airway Management Planned:   Additional Equipment:   Intra-op Plan:   Post-operative Plan:   Informed Consent: I have reviewed the patients History and Physical, chart, labs and discussed the procedure including the risks, benefits and alternatives for the proposed anesthesia with the patient or authorized representative who has indicated his/her understanding and acceptance.     Dental Advisory Given  Plan Discussed with: CRNA  Anesthesia Plan Comments:         Anesthesia Quick Evaluation

## 2021-11-17 NOTE — H&P (Signed)
[facility]   Primary Care Physician:  Kandyce Rud, MD Ophthalmologist: Dr. Willey Blade  Pre-Procedure History & Physical: HPI:  NENA HAMPE is a 81 y.o. female here for cataract surgery.   Past Medical History:  Diagnosis Date   Cystocele 04/16/2016   Diabetes mellitus without complication (HCC)    Frequent urination 04/16/2016   High cholesterol    History of hiatal hernia    Hypertension    Stress incontinence 04/16/2016   Vaginal atrophy 04/16/2016    Past Surgical History:  Procedure Laterality Date   ABDOMINAL HYSTERECTOMY     BLADDER SUSPENSION  2018   CARPAL TUNNEL RELEASE Right    CYSTOCELE REPAIR N/A 04/19/2017   Procedure: ANTERIOR COLPORRHAPY WITH KELLY PLICATION AND ENTEROCELE LIGATION;  Surgeon: Herold Harms, MD;  Location: [facility];  Service: Gynecology;  Laterality: N/A;   OOPHORECTOMY     PUBOVAGINAL SLING N/A 10/18/2017   Procedure: Leonides Grills;  Surgeon: Alfredo Martinez, MD;  Location: [facility];  Service: Urology;  Laterality: N/A;    Prior to Admission medications   Medication Sig Start Date End Date Taking? Authorizing Provider  ASPIRIN 81 PO Take by mouth daily.   Yes [provider]  citalopram (CELEXA) 10 MG tablet Take 10 mg by mouth daily.   Yes [provider]  ezetimibe (ZETIA) 10 MG tablet Take 10 mg by mouth daily.  03/03/16  Yes [provider]  lisinopril-hydrochlorothiazide (PRINZIDE,ZESTORETIC) 20-12.5 MG tablet Take 1 tablet by mouth daily.  03/03/16  Yes [provider]  metFORMIN (GLUCOPHAGE-XR) 500 MG 24 hr tablet Take 500 mg by mouth daily with supper.  11/18/15  Yes [provider]  Multiple Vitamin (MULTIVITAMIN WITH MINERALS) TABS tablet Take 1 tablet by mouth daily. For Women   Yes [provider]  nitrofurantoin (MACRODANTIN) 100 MG capsule Take 1 capsule (100 mg total) by mouth 2 (two) times daily. 11/08/17  Yes MacDiarmid, Lorin Picket, MD  triamcinolone cream  (KENALOG) 0.1 % Apply 1 application topically daily as needed (rash).  01/10/16   [provider]    Allergies as of 10/01/2021 - Review Complete 01/10/2018  Allergen Reaction Noted   Bee venom Anaphylaxis 10/13/2017   Atorvastatin Other (See Comments) 03/23/2017   Ciprofloxacin Swelling 04/16/2016   Rosuvastatin Other (See Comments) 04/16/2016   Sulfa antibiotics Nausea And Vomiting 04/16/2016    Family History  Problem Relation Age of Onset   Breast cancer Maternal Aunt 68   Heart attack Father    Diabetes Neg Hx    Colon cancer Neg Hx    Ovarian cancer Neg Hx     Social History   Socioeconomic History   Marital status: Married    Spouse name: Not on file   Number of children: Not on file   Years of education: Not on file   Highest education level: Not on file  Occupational History   Not on file  Tobacco Use   Smoking status: Never   Smokeless tobacco: Never  Vaping Use   Vaping Use: Never used  Substance and Sexual Activity   Alcohol use: No   Drug use: No   Sexual activity: Never    Birth control/protection: Surgical  Other Topics Concern   Not on file  Social History Narrative   Not on file   Social Determinants of Health   Financial Resource Strain: Not on file  Food Insecurity: Not on file  Transportation Needs: Not on file  Physical Activity: Not  on file  Stress: Not on file  Social Connections: Not on file  Intimate Partner Violence: Not on file    Review of Systems: See HPI, otherwise negative ROS  Physical Exam: BP (!) 182/70    Pulse 71    Temp (!) 97.3 F (36.3 C)    Resp 12    Ht 5\' 3"  (1.6 m)    Wt 70.8 kg    SpO2 97%    BMI 27.63 kg/m  General:   Alert, cooperative in NAD Head:  Normocephalic and atraumatic. Respiratory:  Normal work of breathing. Cardiovascular:  RRR  Impression/Plan: LINLEY MOSKAL is here for cataract surgery.  Risks, benefits, limitations, and alternatives regarding cataract surgery have been  reviewed with the patient.  Questions have been answered.  All parties agreeable.   Richelle Ito, MD  11/17/2021, 10:23 AM

## 2021-11-17 NOTE — Transfer of Care (Signed)
Immediate Anesthesia Transfer of Care Note  Patient: Kara Sullivan  Procedure(s) Performed: CATARACT EXTRACTION PHACO AND INTRAOCULAR LENS PLACEMENT (IOC) LEFT DIABETIC (Left: Eye)  Patient Location: PACU  Anesthesia Type: MAC  Level of Consciousness: awake, alert  and patient cooperative  Airway and Oxygen Therapy: Patient Spontanous Breathing and Patient connected to supplemental oxygen  Post-op Assessment: Post-op Vital signs reviewed, Patient's Cardiovascular Status Stable, Respiratory Function Stable, Patent Airway and No signs of Nausea or vomiting  Post-op Vital Signs: Reviewed and stable  Complications: No notable events documented.

## 2021-11-17 NOTE — Anesthesia Procedure Notes (Signed)
Procedure Name: MAC Date/Time: 11/17/2021 10:33 AM Performed by: Jeannene Patella, CRNA Pre-anesthesia Checklist: Patient identified, Emergency Drugs available, Suction available, Timeout performed and Patient being monitored Patient Re-evaluated:Patient Re-evaluated prior to induction Oxygen Delivery Method: Nasal cannula Placement Confirmation: positive ETCO2

## 2021-11-17 NOTE — Anesthesia Postprocedure Evaluation (Signed)
Anesthesia Post Note  Patient: SUEZETTE LAFAVE  Procedure(s) Performed: CATARACT EXTRACTION PHACO AND INTRAOCULAR LENS PLACEMENT (IOC) LEFT DIABETIC (Left: Eye)     Patient location during evaluation: PACU Anesthesia Type: MAC Level of consciousness: awake and alert and oriented Pain management: satisfactory to patient Vital Signs Assessment: post-procedure vital signs reviewed and stable Respiratory status: spontaneous breathing, nonlabored ventilation and respiratory function stable Cardiovascular status: blood pressure returned to baseline and stable Postop Assessment: Adequate PO intake and No signs of nausea or vomiting Anesthetic complications: no   No notable events documented.  Raliegh Ip

## 2021-11-18 ENCOUNTER — Encounter: Payer: Self-pay | Admitting: Ophthalmology

## 2021-11-27 NOTE — Discharge Instructions (Signed)

## 2021-12-01 ENCOUNTER — Ambulatory Visit
Admission: RE | Admit: 2021-12-01 | Discharge: 2021-12-01 | Disposition: A | Discharge status: Home / Self Care | Payer: Medicare Other | Attending: Ophthalmology | Admitting: Ophthalmology

## 2021-12-01 ENCOUNTER — Encounter: Payer: Self-pay | Admitting: Ophthalmology

## 2021-12-01 ENCOUNTER — Ambulatory Visit: Payer: Medicare Other | Admitting: Anesthesiology

## 2021-12-01 ENCOUNTER — Other Ambulatory Visit: Payer: Self-pay

## 2021-12-01 ENCOUNTER — Encounter
Admission: RE | Disposition: A | Discharge status: Home / Self Care | Payer: Self-pay | Source: Home / Self Care | Attending: Ophthalmology

## 2021-12-01 DIAGNOSIS — K449 Diaphragmatic hernia without obstruction or gangrene: Secondary | ICD-10-CM | POA: Insufficient documentation

## 2021-12-01 DIAGNOSIS — I10 Essential (primary) hypertension: Secondary | ICD-10-CM | POA: Insufficient documentation

## 2021-12-01 DIAGNOSIS — Z7984 Long term (current) use of oral hypoglycemic drugs: Secondary | ICD-10-CM | POA: Insufficient documentation

## 2021-12-01 DIAGNOSIS — H2511 Age-related nuclear cataract, right eye: Secondary | ICD-10-CM | POA: Insufficient documentation

## 2021-12-01 DIAGNOSIS — E1136 Type 2 diabetes mellitus with diabetic cataract: Secondary | ICD-10-CM | POA: Diagnosis present

## 2021-12-01 HISTORY — PX: CATARACT EXTRACTION W/PHACO: SHX586

## 2021-12-01 LAB — GLUCOSE, CAPILLARY: Glucose-Capillary: 121 mg/dL — ABNORMAL HIGH (ref 70–99)

## 2021-12-01 SURGERY — PHACOEMULSIFICATION, CATARACT, WITH IOL INSERTION
Anesthesia: Monitor Anesthesia Care | Site: Eye | Laterality: Right

## 2021-12-01 MED ORDER — LACTATED RINGERS IV SOLN: INTRAVENOUS | Status: DC

## 2021-12-01 MED ORDER — LIDOCAINE HCL (PF) 2 % IJ SOLN
INTRAOCULAR | Status: DC | PRN
  Administered 2021-12-01: 1 mL via INTRAOCULAR

## 2021-12-01 MED ORDER — SIGHTPATH DOSE#1 SODIUM HYALURONATE 10 MG/ML IO SOLUTION
PREFILLED_SYRINGE | INTRAOCULAR | Status: DC | PRN
  Administered 2021-12-01: 0.85 mL via INTRAOCULAR

## 2021-12-01 MED ORDER — SIGHTPATH DOSE#1 SODIUM HYALURONATE 23 MG/ML IO SOLUTION
PREFILLED_SYRINGE | INTRAOCULAR | Status: DC | PRN
  Administered 2021-12-01: 0.6 mL via INTRAOCULAR

## 2021-12-01 MED ORDER — CEFUROXIME OPHTHALMIC INJECTION 1 MG/0.1 ML
INJECTION | OPHTHALMIC | Status: DC | PRN
  Administered 2021-12-01: 1 mg via OPHTHALMIC

## 2021-12-01 MED ORDER — SIGHTPATH DOSE#1 BSS IO SOLN
INTRAOCULAR | Status: DC | PRN
  Administered 2021-12-01: 69 mL via OPHTHALMIC

## 2021-12-01 MED ORDER — ACETAMINOPHEN 325 MG PO TABS: 325.0000 mg | ORAL_TABLET | ORAL | Status: DC | PRN

## 2021-12-01 MED ORDER — ARMC OPHTHALMIC DILATING DROPS
1.0000 "application " | OPHTHALMIC | Status: DC | PRN
  Administered 2021-12-01 (×3): 1 via OPHTHALMIC

## 2021-12-01 MED ORDER — MIDAZOLAM HCL 2 MG/2ML IJ SOLN
INTRAMUSCULAR | Status: DC | PRN
  Administered 2021-12-01: 2 mg via INTRAVENOUS

## 2021-12-01 MED ORDER — SIGHTPATH DOSE#1 BSS IO SOLN
INTRAOCULAR | Status: DC | PRN
  Administered 2021-12-01: 15 mL

## 2021-12-01 MED ORDER — TETRACAINE HCL 0.5 % OP SOLN
1.0000 [drp] | OPHTHALMIC | Status: DC | PRN
  Administered 2021-12-01 (×3): 1 [drp] via OPHTHALMIC

## 2021-12-01 MED ORDER — ACETAMINOPHEN 160 MG/5ML PO SOLN: 325.0000 mg | ORAL | Status: DC | PRN

## 2021-12-01 MED ORDER — FENTANYL CITRATE (PF) 100 MCG/2ML IJ SOLN
INTRAMUSCULAR | Status: DC | PRN
  Administered 2021-12-01: 50 ug via INTRAVENOUS

## 2021-12-01 SURGICAL SUPPLY — 14 items
CATARACT SUITE SIGHTPATH (MISCELLANEOUS) ×2 IMPLANT
DISSECTOR HYDRO NUCLEUS 50X22 (MISCELLANEOUS) ×2 IMPLANT
FEE CATARACT SUITE SIGHTPATH (MISCELLANEOUS) ×1 IMPLANT
GLOVE SURG GAMMEX PI TX LF 7.5 (GLOVE) ×2 IMPLANT
GLOVE SURG SYN 8.5  E (GLOVE) ×1
GLOVE SURG SYN 8.5 E (GLOVE) ×1 IMPLANT
GLOVE SURG SYN 8.5 PF PI (GLOVE) ×1 IMPLANT
LENS IOL TECNIS EYHANCE 22.0 (Intraocular Lens) ×1 IMPLANT
NDL FILTER BLUNT 18X1 1/2 (NEEDLE) ×1 IMPLANT
NEEDLE FILTER BLUNT 18X 1/2SAF (NEEDLE) ×1
NEEDLE FILTER BLUNT 18X1 1/2 (NEEDLE) ×1 IMPLANT
SYR 3ML LL SCALE MARK (SYRINGE) ×2 IMPLANT
SYR 5ML LL (SYRINGE) ×2 IMPLANT
WATER STERILE IRR 250ML POUR (IV SOLUTION) ×2 IMPLANT

## 2021-12-01 NOTE — Transfer of Care (Signed)
Immediate Anesthesia Transfer of Care Note  Patient: Kara Sullivan  Procedure(s) Performed: CATARACT EXTRACTION PHACO AND INTRAOCULAR LENS PLACEMENT (IOC) RIGHT DIABETIC (Right: Eye)  Patient Location: PACU  Anesthesia Type: MAC  Level of Consciousness: awake, alert  and patient cooperative  Airway and Oxygen Therapy: Patient Spontanous Breathing and Patient connected to supplemental oxygen  Post-op Assessment: Post-op Vital signs reviewed, Patient's Cardiovascular Status Stable, Respiratory Function Stable, Patent Airway and No signs of Nausea or vomiting  Post-op Vital Signs: Reviewed and stable  Complications: No notable events documented.

## 2021-12-01 NOTE — Anesthesia Preprocedure Evaluation (Signed)
Anesthesia Evaluation  Patient identified by MRN, date of birth, ID band Patient awake    Reviewed: Allergy & Precautions, H&P , NPO status , Patient's Chart, lab work & pertinent test results, reviewed documented beta blocker date and time   Airway Mallampati: II  TM Distance: >3 FB Neck ROM: full    Dental no notable dental hx.    Pulmonary neg pulmonary ROS,    Pulmonary exam normal breath sounds clear to auscultation       Cardiovascular Exercise Tolerance: Good hypertension, Normal cardiovascular exam Rhythm:regular Rate:Normal     Neuro/Psych negative neurological ROS  negative psych ROS   GI/Hepatic Neg liver ROS, hiatal hernia,   Endo/Other  diabetes  Renal/GU negative Renal ROS  negative genitourinary   Musculoskeletal   Abdominal   Peds  Hematology negative hematology ROS (+)   Anesthesia Other Findings   Reproductive/Obstetrics negative OB ROS                             Anesthesia Physical Anesthesia Plan  ASA: 2  Anesthesia Plan: MAC   Post-op Pain Management:    Induction:   PONV Risk Score and Plan:   Airway Management Planned:   Additional Equipment:   Intra-op Plan:   Post-operative Plan:   Informed Consent: I have reviewed the patients History and Physical, chart, labs and discussed the procedure including the risks, benefits and alternatives for the proposed anesthesia with the patient or authorized representative who has indicated his/her understanding and acceptance.     Dental Advisory Given  Plan Discussed with: CRNA and Anesthesiologist  Anesthesia Plan Comments:         Anesthesia Quick Evaluation

## 2021-12-01 NOTE — Anesthesia Postprocedure Evaluation (Signed)
Anesthesia Post Note  Patient: Kara Sullivan  Procedure(s) Performed: CATARACT EXTRACTION PHACO AND INTRAOCULAR LENS PLACEMENT (IOC) RIGHT DIABETIC (Right: Eye)     Patient location during evaluation: PACU Anesthesia Type: MAC Level of consciousness: awake and alert Pain management: pain level controlled Vital Signs Assessment: post-procedure vital signs reviewed and stable Respiratory status: spontaneous breathing, nonlabored ventilation, respiratory function stable and patient connected to nasal cannula oxygen Cardiovascular status: stable and blood pressure returned to baseline Postop Assessment: no apparent nausea or vomiting Anesthetic complications: no   No notable events documented.  Trecia Rogers

## 2021-12-01 NOTE — H&P (Signed)
[facility]   Primary Care Physician:  Derinda Late, MD Ophthalmologist: Dr. Benay Pillow  Pre-Procedure History & Physical: HPI:  Kara Sullivan is a 81 y.o. female here for cataract surgery.   Past Medical History:  Diagnosis Date   Cystocele 04/16/2016   Diabetes mellitus without complication (HCC)    Frequent urination 04/16/2016   High cholesterol    History of hiatal hernia    Hypertension    Stress incontinence 04/16/2016   Vaginal atrophy 04/16/2016    Past Surgical History:  Procedure Laterality Date   ABDOMINAL HYSTERECTOMY     BLADDER SUSPENSION  2018   CARPAL TUNNEL RELEASE Right    CATARACT EXTRACTION W/PHACO Left 11/17/2021   Procedure: CATARACT EXTRACTION PHACO AND INTRAOCULAR LENS PLACEMENT ([place]) LEFT DIABETIC;  Surgeon: Eulogio Bear, MD;  Location: [facility];  Service: Ophthalmology;  Laterality: Left;  6.91 00:48.0   CYSTOCELE REPAIR N/A 04/19/2017   Procedure: ANTERIOR COLPORRHAPY WITH KELLY PLICATION AND ENTEROCELE LIGATION;  Surgeon: Brayton Mars, MD;  Location: [facility];  Service: Gynecology;  Laterality: N/A;   OOPHORECTOMY     PUBOVAGINAL SLING N/A 10/18/2017   Procedure: Gaynelle Arabian;  Surgeon: Bjorn Loser, MD;  Location: [facility];  Service: Urology;  Laterality: N/A;    Prior to Admission medications   Medication Sig Start Date End Date Taking? Authorizing Provider  ASPIRIN 81 PO Take by mouth daily.   Yes [provider]  citalopram (CELEXA) 10 MG tablet Take 10 mg by mouth daily.   Yes [provider]  ezetimibe (ZETIA) 10 MG tablet Take 10 mg by mouth daily.  03/03/16  Yes [provider]  lisinopril-hydrochlorothiazide (PRINZIDE,ZESTORETIC) 20-12.5 MG tablet Take 1 tablet by mouth daily.  03/03/16  Yes [provider]  metFORMIN (GLUCOPHAGE-XR) 500 MG 24 hr tablet Take 500 mg by mouth daily with supper.  11/18/15  Yes [provider]  Multiple Vitamin (MULTIVITAMIN  WITH MINERALS) TABS tablet Take 1 tablet by mouth daily. For Women   Yes [provider]  nitrofurantoin (MACRODANTIN) 100 MG capsule Take 1 capsule (100 mg total) by mouth 2 (two) times daily. 11/08/17  Yes MacDiarmid, Nicki Reaper, MD  triamcinolone cream (KENALOG) 0.1 % Apply 1 application topically daily as needed (rash).  01/10/16   [provider]    Allergies as of 10/01/2021 - Review Complete 01/10/2018  Allergen Reaction Noted   Bee venom Anaphylaxis 10/13/2017   Atorvastatin Other (See Comments) 03/23/2017   Ciprofloxacin Swelling 04/16/2016   Rosuvastatin Other (See Comments) 04/16/2016   Sulfa antibiotics Nausea And Vomiting 04/16/2016    Family History  Problem Relation Age of Onset   Breast cancer Maternal Aunt 68   Heart attack Father    Diabetes Neg Hx    Colon cancer Neg Hx    Ovarian cancer Neg Hx     Social History   Socioeconomic History   Marital status: Married    Spouse name: Not on file   Number of children: Not on file   Years of education: Not on file   Highest education level: Not on file  Occupational History   Not on file  Tobacco Use   Smoking status: Never   Smokeless tobacco: Never  Vaping Use   Vaping Use: Never used  Substance and Sexual Activity   Alcohol use: No   Drug use: No   Sexual activity: Never    Birth control/protection: Surgical  Other Topics Concern   Not on  file  Social History Narrative   Not on file   Social Determinants of Health   Financial Resource Strain: Not on file  Food Insecurity: Not on file  Transportation Needs: Not on file  Physical Activity: Not on file  Stress: Not on file  Social Connections: Not on file  Intimate Partner Violence: Not on file    Review of Systems: See HPI, otherwise negative ROS  Physical Exam: BP (!) 163/67    Pulse 75    Temp 97.7 F (36.5 C) (Temporal)    Wt 69.4 kg    SpO2 95%    BMI 27.10 kg/m  General:   Alert, cooperative in NAD Head:  Normocephalic and  atraumatic. Respiratory:  Normal work of breathing. Cardiovascular:  RRR  Impression/Plan: Kara Sullivan is here for cataract surgery.  Risks, benefits, limitations, and alternatives regarding cataract surgery have been reviewed with the patient.  Questions have been answered.  All parties agreeable.   Benay Pillow, MD  12/01/2021, 10:31 AM

## 2021-12-01 NOTE — Op Note (Signed)
OPERATIVE NOTE  Kara Sullivan [id number] 12/01/2021   PREOPERATIVE DIAGNOSIS:  Nuclear sclerotic cataract right eye.  H25.11   POSTOPERATIVE DIAGNOSIS:    Nuclear sclerotic cataract right eye.     PROCEDURE:  Phacoemusification with posterior chamber intraocular lens placement of the right eye   LENS:   Implant Name Type Inv. Item Serial No. Manufacturer Lot No. LRB No. Used Action  LENS IOL TECNIS EYHANCE 22.0 - [id number] Intraocular Lens LENS IOL TECNIS EYHANCE 22.0 [id number] [employer]  Right 1 Implanted       Procedure(s) with comments: CATARACT EXTRACTION PHACO AND INTRAOCULAR LENS PLACEMENT (IOC) RIGHT DIABETIC (Right) - 4.95 00:40.6  DIB00 +22.0   ULTRASOUND TIME: 0 minutes 40 seconds.  CDE 4.95   SURGEON:  Willey Blade, MD, MPH  ANESTHESIOLOGIST: Anesthesiologist: Baxter Flattery, MD CRNA: Michaele Offer, CRNA   ANESTHESIA:  Topical with tetracaine drops augmented with 1% preservative-free intracameral lidocaine.  ESTIMATED BLOOD LOSS: less than 1 mL.   COMPLICATIONS:  None.   DESCRIPTION OF PROCEDURE:  The patient was identified in the holding room and transported to the operating room and placed in the supine position under the operating microscope.  The right eye was identified as the operative eye and it was prepped and draped in the usual sterile ophthalmic fashion.   A 1.0 millimeter clear-corneal paracentesis was made at the 10:30 position. 0.5 ml of preservative-free 1% lidocaine with epinephrine was injected into the anterior chamber.  The anterior chamber was filled with Healon 5 viscoelastic.  A 2.4 millimeter keratome was used to make a near-clear corneal incision at the 8:00 position.  A curvilinear capsulorrhexis was made with a cystotome and capsulorrhexis forceps.  Balanced salt solution was used to hydrodissect and hydrodelineate the nucleus.   Phacoemulsification was then used in stop and chop fashion to remove the lens nucleus and epinucleus.  The  remaining cortex was then removed using the irrigation and aspiration handpiece. Healon was then placed into the capsular bag to distend it for lens placement.  A lens was then injected into the capsular bag.  The remaining viscoelastic was aspirated.   Wounds were hydrated with balanced salt solution.  The anterior chamber was inflated to a physiologic pressure with balanced salt solution.   Intracameral cefuroxime 0.1 mL at 10 mg/mL was injected into the eye.  No wound leaks were noted.  The patient was taken to the recovery room in stable condition without complications of anesthesia or surgery  Willey Blade 12/01/2021, 11:00 AM

## 2021-12-02 ENCOUNTER — Encounter: Payer: Self-pay | Admitting: Ophthalmology

## 2021-12-26 ENCOUNTER — Other Ambulatory Visit: Payer: Self-pay | Admitting: Family Medicine

## 2021-12-26 DIAGNOSIS — Z1231 Encounter for screening mammogram for malignant neoplasm of breast: Secondary | ICD-10-CM

## 2022-04-28 ENCOUNTER — Ambulatory Visit
Admission: RE | Admit: 2022-04-28 | Discharge: 2022-04-28 | Disposition: A | Discharge status: Home / Self Care | Payer: Medicare Other | Source: Ambulatory Visit | Attending: Family Medicine | Admitting: Family Medicine

## 2022-04-28 DIAGNOSIS — Z1231 Encounter for screening mammogram for malignant neoplasm of breast: Secondary | ICD-10-CM | POA: Diagnosis present

## 2022-12-04 IMAGING — MG MM DIGITAL SCREENING BILAT W/ TOMO AND CAD
8 series · 8 of 24 positions shown · non-contrast
Comparison: Previous exam(s).

CLINICAL DATA: Screening.

EXAM:
DIGITAL SCREENING BILATERAL MAMMOGRAM WITH TOMOSYNTHESIS AND CAD
TECHNIQUE: Bilateral screening digital craniocaudal and mediolateral oblique
mammograms were obtained. Bilateral screening digital breast
tomosynthesis was performed. The images were evaluated with
computer-aided detection.

[L CC synth-2D]
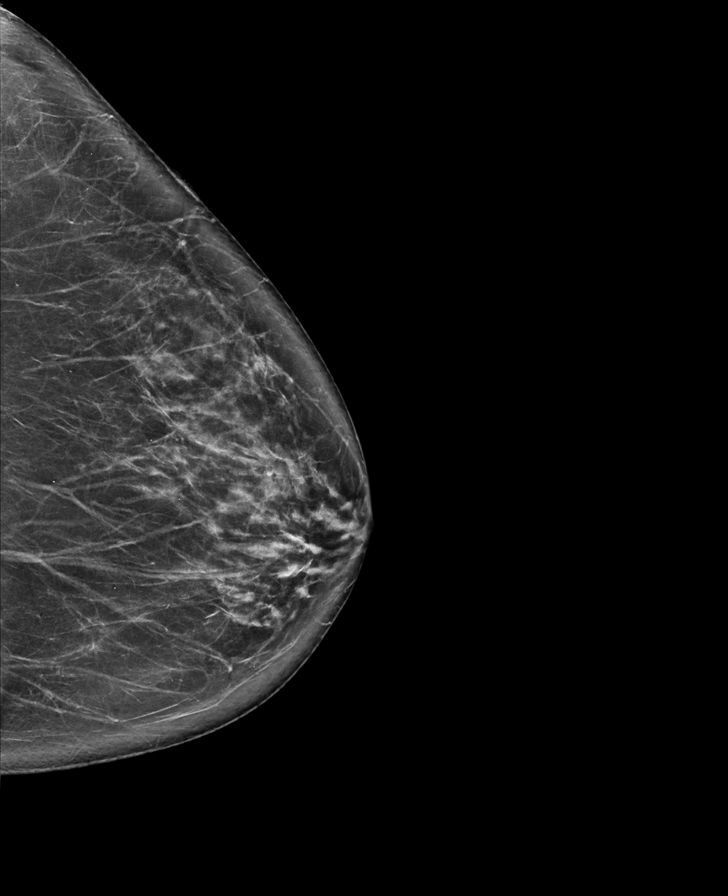

[L MLO synth-2D]
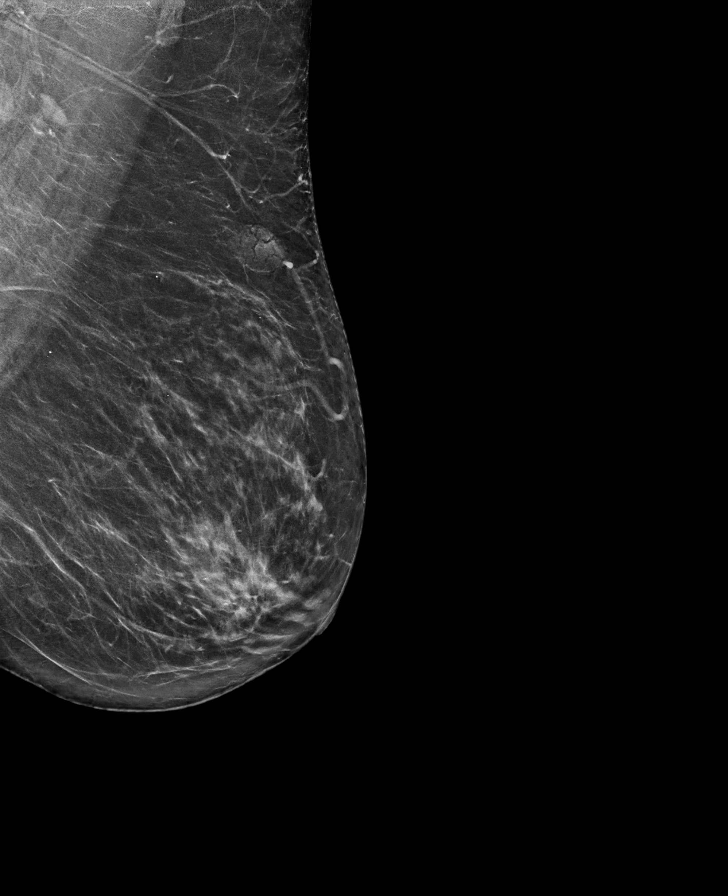

[R CC synth-2D]
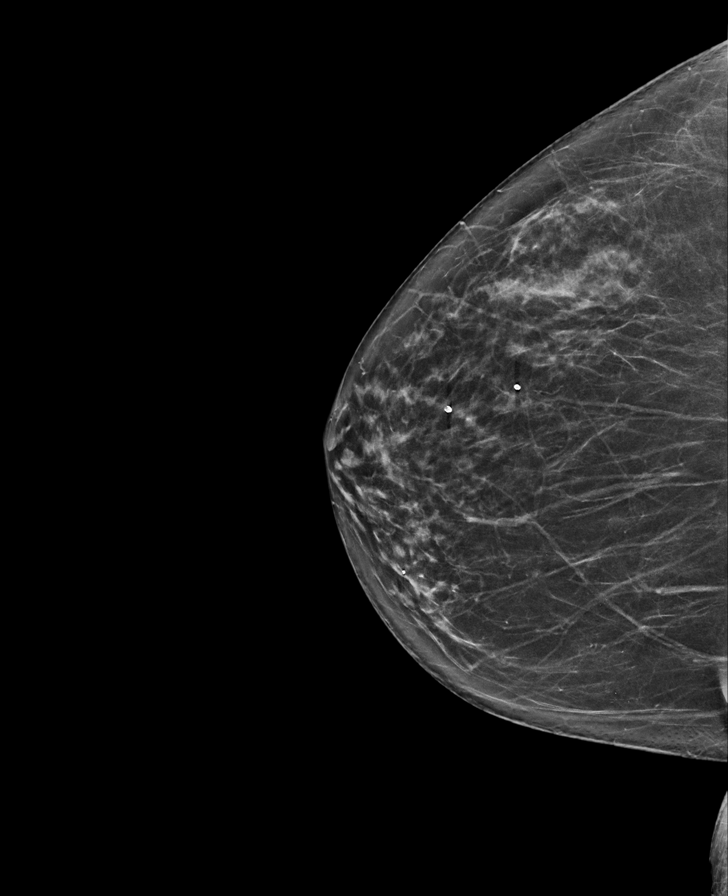

[R MLO synth-2D]
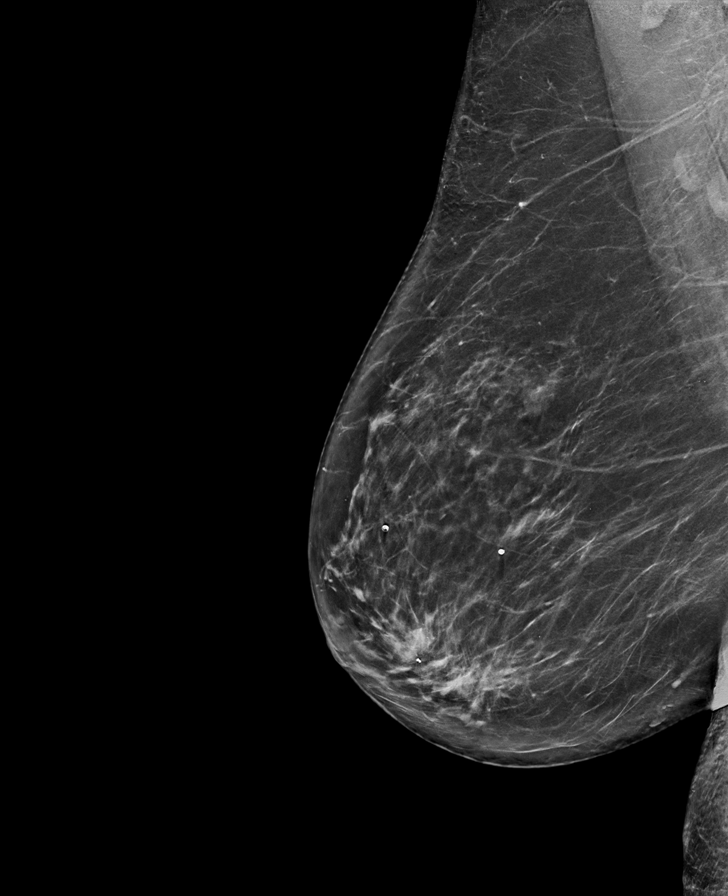

[R MLO tomo · tomo slice 43/86.0]
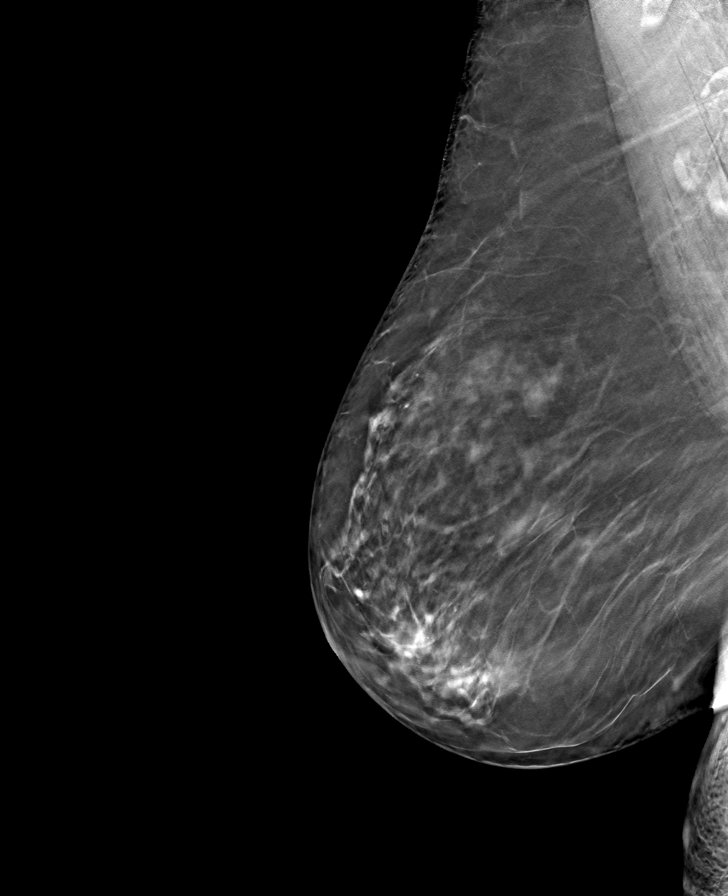

[L MLO tomo · tomo slice 36/71.0]
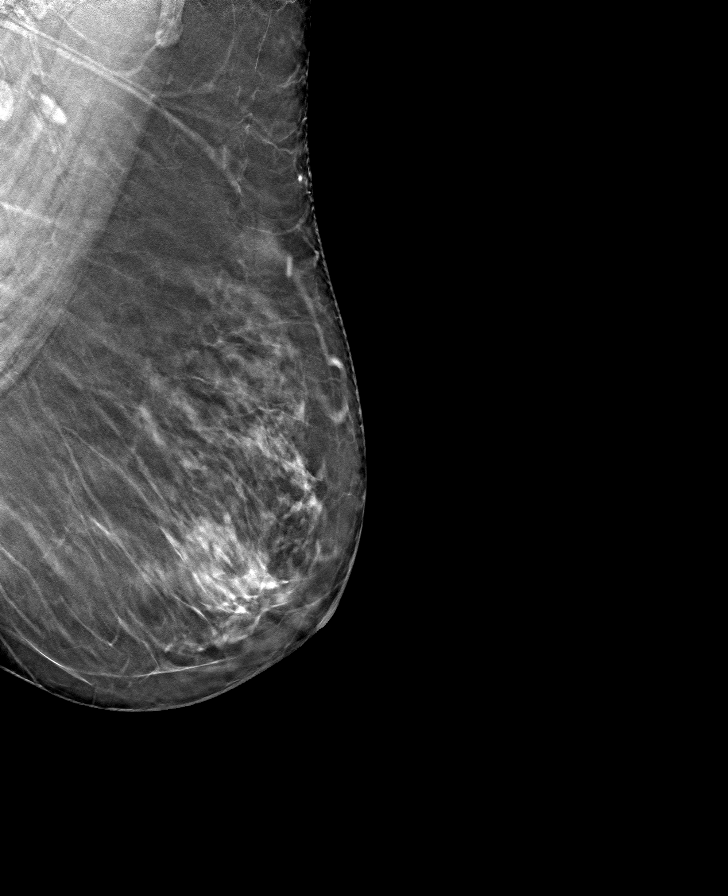

[L CC tomo · tomo slice 40/79.0]
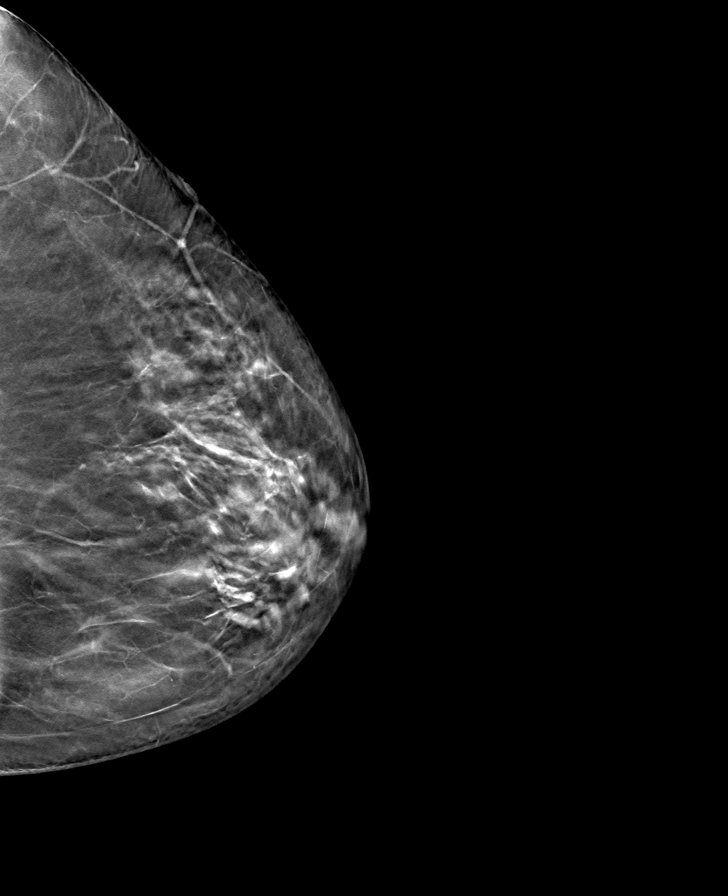

[R CC tomo · tomo slice 33/66.0]
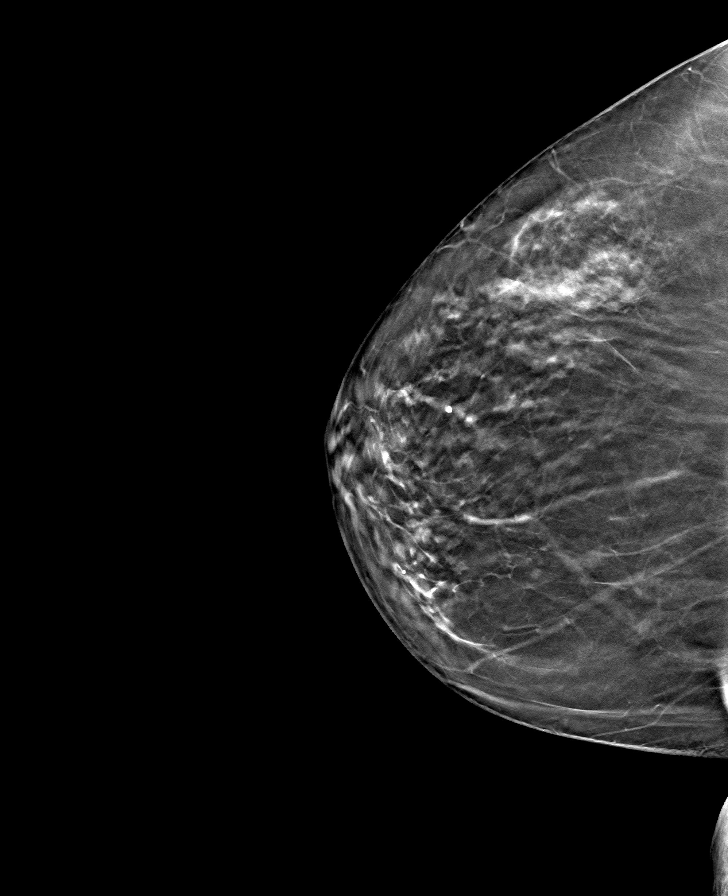

[8 of 24 positions shown; findings below may reference images not displayed]

ACR Breast Density Category b: There are scattered areas of
fibroglandular density.
FINDINGS: There are no findings suspicious for malignancy.
IMPRESSION: No mammographic evidence of malignancy. A result letter of this
screening mammogram will be mailed directly to the patient.

RECOMMENDATION:
Screening mammogram in one year. (Code:51-O-LD2)

BI-RADS CATEGORY  1: Negative.

## 2023-03-17 ENCOUNTER — Other Ambulatory Visit: Payer: Self-pay | Admitting: Family Medicine

## 2023-03-17 DIAGNOSIS — Z1231 Encounter for screening mammogram for malignant neoplasm of breast: Secondary | ICD-10-CM

## 2023-04-30 ENCOUNTER — Ambulatory Visit
Admission: RE | Admit: 2023-04-30 | Discharge: 2023-04-30 | Disposition: A | Discharge status: Home / Self Care | Payer: Medicare Other | Source: Ambulatory Visit | Attending: Family Medicine | Admitting: Family Medicine

## 2023-04-30 DIAGNOSIS — Z1231 Encounter for screening mammogram for malignant neoplasm of breast: Secondary | ICD-10-CM

## 2023-11-06 ENCOUNTER — Encounter (HOSPITAL_COMMUNITY): Payer: Self-pay

## 2023-11-06 ENCOUNTER — Emergency Department (HOSPITAL_COMMUNITY)
Admission: EM | Admit: 2023-11-06 | Discharge: 2023-11-06 | Disposition: A | Discharge status: Home / Self Care | Payer: Medicare Other | Attending: Emergency Medicine | Admitting: Emergency Medicine

## 2023-11-06 ENCOUNTER — Other Ambulatory Visit: Payer: Self-pay

## 2023-11-06 DIAGNOSIS — I10 Essential (primary) hypertension: Secondary | ICD-10-CM | POA: Diagnosis not present

## 2023-11-06 DIAGNOSIS — R197 Diarrhea, unspecified: Secondary | ICD-10-CM | POA: Insufficient documentation

## 2023-11-06 DIAGNOSIS — E119 Type 2 diabetes mellitus without complications: Secondary | ICD-10-CM | POA: Insufficient documentation

## 2023-11-06 DIAGNOSIS — Z20822 Contact with and (suspected) exposure to covid-19: Secondary | ICD-10-CM | POA: Diagnosis not present

## 2023-11-06 DIAGNOSIS — R112 Nausea with vomiting, unspecified: Secondary | ICD-10-CM

## 2023-11-06 DIAGNOSIS — Z7901 Long term (current) use of anticoagulants: Secondary | ICD-10-CM | POA: Insufficient documentation

## 2023-11-06 DIAGNOSIS — R0602 Shortness of breath: Secondary | ICD-10-CM | POA: Diagnosis present

## 2023-11-06 DIAGNOSIS — Z7984 Long term (current) use of oral hypoglycemic drugs: Secondary | ICD-10-CM | POA: Diagnosis not present

## 2023-11-06 DIAGNOSIS — Z7982 Long term (current) use of aspirin: Secondary | ICD-10-CM | POA: Insufficient documentation

## 2023-11-06 DIAGNOSIS — I480 Paroxysmal atrial fibrillation: Secondary | ICD-10-CM

## 2023-11-06 DIAGNOSIS — Z79899 Other long term (current) drug therapy: Secondary | ICD-10-CM | POA: Insufficient documentation

## 2023-11-06 LAB — RESP PANEL BY RT-PCR (RSV, FLU A&B, COVID)  RVPGX2
Influenza A by PCR: NEGATIVE
Influenza B by PCR: NEGATIVE
Resp Syncytial Virus by PCR: NEGATIVE
SARS Coronavirus 2 by RT PCR: NEGATIVE

## 2023-11-06 LAB — BASIC METABOLIC PANEL
Anion gap: 12 (ref 5–15)
BUN: 32 mg/dL — ABNORMAL HIGH (ref 8–23)
CO2: 20 mmol/L — ABNORMAL LOW (ref 22–32)
Calcium: 8.7 mg/dL — ABNORMAL LOW (ref 8.9–10.3)
Chloride: 104 mmol/L (ref 98–111)
Creatinine, Ser: 1.29 mg/dL — ABNORMAL HIGH (ref 0.44–1.00)
GFR, Estimated: 42 mL/min — ABNORMAL LOW (ref >60)
Glucose, Bld: 130 mg/dL — ABNORMAL HIGH (ref 70–99)
Potassium: 3.6 mmol/L (ref 3.5–5.1)
Sodium: 136 mmol/L (ref 135–145)

## 2023-11-06 LAB — CBC
HCT: 33.9 % — ABNORMAL LOW (ref 36.0–46.0)
Hemoglobin: 11.2 g/dL — ABNORMAL LOW (ref 12.0–15.0)
MCH: 32 pg (ref 26.0–34.0)
MCHC: 33 g/dL (ref 30.0–36.0)
MCV: 96.9 fL (ref 80.0–100.0)
Platelets: 394 10*3/uL (ref 150–400)
RBC: 3.5 MIL/uL — ABNORMAL LOW (ref 3.87–5.11)
RDW: 13.2 % (ref 11.5–15.5)
WBC: 8 10*3/uL (ref 4.0–10.5)
nRBC: 0 % (ref 0.0–0.2)

## 2023-11-06 LAB — MAGNESIUM: Magnesium: 2.1 mg/dL (ref 1.7–2.4)

## 2023-11-06 MED ORDER — ONDANSETRON 4 MG PO TBDP: 4.0000 mg | ORAL_TABLET | Freq: Three times a day (TID) | ORAL | 0 refills | Status: AC | PRN

## 2023-11-06 MED ORDER — APIXABAN 5 MG PO TABS
5.0000 mg | ORAL_TABLET | Freq: Two times a day (BID) | ORAL | Status: DC
  Administered 2023-11-06: 5 mg via ORAL
  Filled 2023-11-06: qty 1

## 2023-11-06 MED ORDER — APIXABAN 5 MG PO TABS: 5.0000 mg | ORAL_TABLET | Freq: Two times a day (BID) | ORAL | Status: DC

## 2023-11-06 MED ORDER — APIXABAN 5 MG PO TABS: 5.0000 mg | ORAL_TABLET | Freq: Two times a day (BID) | ORAL | 1 refills | Status: AC

## 2023-11-06 MED ORDER — LACTATED RINGERS IV BOLUS
1000.0000 mL | Freq: Once | INTRAVENOUS | Status: AC
  Administered 2023-11-06: 1000 mL via INTRAVENOUS

## 2023-11-06 NOTE — Discharge Instructions (Signed)
Get help right away if you: Have chest pain. Have trouble breathing or you are breathing very quickly. Have a fast heartbeat. Feel extremely weak or you faint. Have a severe headache, a stiff neck, or both. Have a rash. Have severe pain, cramping, or bloating in your abdomen. Have skin that feels cold and clammy. Feel confused. Have pain when you urinate. Have signs of dehydration, such as: Dark urine, very little urine, or no urine. Cracked lips. Dry mouth. Sunken eyes. Sleepiness. Weakness. Have signs of bleeding, such as: Seeing blood in your vomit. Having vomit that looks like coffee grounds. Having bloody or black stools or stools that look like tar. These symptoms may be an emergency. Get help right away. Call 911.  Get help right away if:  You have chest pain. You have trouble breathing. You have side effects of blood thinners, such as blood in your vomit, poop (stool), or pee (urine), or bleeding that does not stop. You have any symptoms of a stroke. "BE FAST" is an easy way to remember the main warning signs of a stroke: B - Balance. Signs are dizziness, sudden trouble walking, or loss of balance. E - Eyes. Signs are trouble seeing or a sudden change in vision. F - Face. Signs are sudden weakness or numbness of the face, or the face or eyelid drooping on one side. A - Arms. Signs are weakness or numbness in an arm. This happens suddenly and usually on one side of the body. S - Speech.Signs are sudden trouble speaking, slurred speech, or trouble understanding what people say. T - Time. Time to call emergency services. Write down what time symptoms started. Other signs of a stroke, such as: A sudden, severe headache with no known cause. Nausea or vomiting. Seizure.

## 2023-11-06 NOTE — ED Notes (Signed)
Patient verbalizes understanding of discharge instructions. Opportunity for questioning and answers were provided.   Prescriptions reviewed and pharmacy verified. Education given by ED Pharmacist on Eliquis.   Patient discharged with daughter.

## 2023-11-06 NOTE — ED Notes (Signed)
Patient states she has given urine sample and previous nurse reports urine was sent off on previous shift. Lab called to inquire about sample and reported they have no urine sample for this patient. Patient unable to give another sample at this time and requested to leave before sample could be recollected. PA notified.

## 2023-11-06 NOTE — ED Provider Notes (Incomplete)
Kara Sullivan EMERGENCY DEPARTMENT AT [facility] Provider Note   CSN: [id number] Arrival date & time: 11/06/23  1551     History {Add pertinent medical, surgical, social history, OB history to HPI:1} Chief Complaint  Patient presents with  . Dizziness    Kara Sullivan is a 83 y.o. female brought in by EMS for palpitations shortness of breath and dizziness.  She is an 83 year old female with a past medical history of hypertension, diabetes.  Gathered by the patient and EMS at bedside.  Reports the patient has had 4 days of nausea vomiting and diarrhea.  Patient reports that today she finally felt good enough to get up and shower..  She states that while she was in the shower she felt her heart skipping and racing.  She became markedly short of breath and felt like she was going to pass out.  Upon arrival with EMS patient appeared to have atrial fibrillation with a rapid ventricular rate of about 150.  She has no previous history of atrial fibrillation.  She is given Zofran prior to arrival and states that her nausea is significantly better.  She is unsure if she had any similar symptoms over the past few days.  She denies chest pain.   Dizziness      Home Medications Prior to Admission medications   Medication Sig Start Date End Date Taking? Authorizing Provider  ASPIRIN 81 PO Take by mouth daily.    [provider]  citalopram (CELEXA) 10 MG tablet Take 10 mg by mouth daily.    [provider]  ezetimibe (ZETIA) 10 MG tablet Take 10 mg by mouth daily.  03/03/16   [provider]  lisinopril-hydrochlorothiazide (PRINZIDE,ZESTORETIC) 20-12.5 MG tablet Take 1 tablet by mouth daily.  03/03/16   [provider]  metFORMIN (GLUCOPHAGE-XR) 500 MG 24 hr tablet Take 500 mg by mouth daily with supper.  11/18/15   [provider]  Multiple Vitamin (MULTIVITAMIN WITH MINERALS) TABS tablet Take 1 tablet by mouth daily. For Women    [provider]  nitrofurantoin (MACRODANTIN) 100 MG capsule Take 1 capsule (100 mg total) by mouth 2 (two) times daily. 11/08/17   Alfredo Martinez, MD  triamcinolone cream (KENALOG) 0.1 % Apply 1 application topically daily as needed (rash).  01/10/16   [provider]      Allergies    Bee venom, Atorvastatin, Ciprofloxacin, Rosuvastatin, and Sulfa antibiotics    Review of Systems   Review of Systems  Neurological:  Positive for dizziness.    Physical Exam Updated Vital Signs BP (!) 131/56   Pulse 76   Temp 98.2 F (36.8 C) (Oral)   Resp 14   Ht 5\' 3"  (1.6 m)   Wt 68 kg   SpO2 98%   BMI 26.57 kg/m  Physical Exam Vitals and nursing note reviewed.  Constitutional:      General: She is not in acute distress.    Appearance: She is well-developed. She is not diaphoretic.  HENT:     Head: Normocephalic and atraumatic.     Right Ear: External ear normal.     Left Ear: External ear normal.     Nose: Nose normal.     Mouth/Throat:     Mouth: Mucous membranes are moist.  Eyes:     General: No scleral icterus.    Conjunctiva/sclera: Conjunctivae normal.  Cardiovascular:     Rate and Rhythm: Regular rhythm. Tachycardia present.     Heart sounds:  Normal heart sounds. No murmur heard.    No friction rub. No gallop.     Comments: Patient's pulse feels regular at this point Pulmonary:     Effort: Pulmonary effort is normal. No respiratory distress.     Breath sounds: Normal breath sounds.  Abdominal:     General: Bowel sounds are normal. There is no distension.     Palpations: Abdomen is soft. There is no mass.     Tenderness: There is no abdominal tenderness. There is no guarding.  Musculoskeletal:     Cervical back: Normal range of motion.  Skin:    General: Skin is warm and dry.  Neurological:     Mental Status: She is alert and oriented to person, place, and time.  Psychiatric:        Behavior: Behavior normal.     ED Results / Procedures / Treatments    Labs (all labs ordered are listed, but only abnormal results are displayed) Labs Reviewed  BASIC METABOLIC PANEL - Abnormal; Notable for the following components:      Result Value   CO2 20 (*)    Glucose, Bld 130 (*)    BUN 32 (*)    Creatinine, Ser 1.29 (*)    Calcium 8.7 (*)    GFR, Estimated 42 (*)    All other components within normal limits  CBC - Abnormal; Notable for the following components:   RBC 3.50 (*)    Hemoglobin 11.2 (*)    HCT 33.9 (*)    All other components within normal limits  RESP PANEL BY RT-PCR (RSV, FLU A&B, COVID)  RVPGX2  MAGNESIUM    EKG None  Radiology No results found.  Procedures Procedures  {Document cardiac monitor, telemetry assessment procedure when appropriate:1}  Medications Ordered in ED Medications  apixaban (ELIQUIS) tablet 5 mg (5 mg Oral Given 11/06/23 1721)  lactated ringers bolus 1,000 mL (1,000 mLs Intravenous New Bag/Given 11/06/23 1617)    ED Course/ Medical Decision Making/ A&P Clinical Course as of 11/06/23 1735  Sat Nov 06, 2023  1617 Case discussed with Dr.Mealor [facility] cardiology.  We reviewed patient's initial EKG.  I was concerned that patient appears to have P waves in V1 however he states that he agrees this looks like A-fib.  Patient now in sinus rhythm at a rate of 85. [AH]  1734 Basic metabolic panel(!) [AH]  1734 Creatinine(!): 1.29 [AH]    Clinical Course User Index [AH] Arthor Captain, PA-C   {   Click here for ABCD2, HEART and other calculatorsREFRESH Note before signing :1}      CHA2DS2-VASc Score: 5                        Medical Decision Making Amount and/or Complexity of Data Reviewed Labs: ordered.  Risk Prescription drug management.   This patient presents to the ED with chief complaint(s) of dizziness palpitations with pertinent past medical history of hypertension diabetes recent GI illness which further complicates the presenting complaint. The complaint involves an extensive  differential diagnosis and treatment options and also carries with it a high risk of complications and morbidity.    The differential diagnosis includes The differential diagnosis for palpitations includes cardiac arrhythmias, PVC/PAC, ACS, Cardiomyopathy, CHF, MVP, pericarditis, valvular disease, Panic/Anxiety, Somatic disorder, ETOH, Caffeine,  Stimulant use, medication side effect, Anemia, Hyperthyroidism, pulmonary embolism.    The initial plan is to order labs, fluids, cardiac monitoring   Additional Tests and  treatment considered: Patient Moderate risk by This patients CHA2DS2-VASc Score and unadjusted Ischemic Stroke Rate (% per year) is equal to 7.2 % stroke rate/year from a score of 5  Above score calculated as 1 point each if present [CHF, HTN, DM, Vascular=MI/PAD/Aortic Plaque, Age if 65-74, or Female] Above score calculated as 2 points each if present [Age > 75, or Stroke/TIA/TE]  Therefore patient treated with Oral eliquis  Additional history obtained: Additional history obtained from EMS  Records reviewed previous admission documents  Reassessment and review (also see workup area): Lab Tests: I Ordered, and personally interpreted labs.  The pertinent results include:   Creatinine slightly elevated at 1.29 CBC with mild normocytic anemia at baseline for the patient Magnesium within normal limits  Imaging Studies: I ordered and independently visualized and interpreted the following imaging {imaging:26848}  which showed *** The interpretation of the imaging was limited to assessing for emergent pathology, for which purpose it was ordered.  Consultations Obtained: I requested consultation with the {consultation:26851}, and discussed  findings as well as pertinent plan - they recommend: ***  Medicines ordered and prescription drug management: I ordered the following medications *** for ***  I considered this additional medications: ***   Reevaluation of the patient after  these medicines showed that the patient    {resolved/improved/worsened:23923::"improved"}  Social Determinants of Health: Patient's {ZOXW:96045}  increases the complexity of managing their presentation  Cardiac Monitoring: The patient was maintained on a cardiac monitor.  I personally viewed and interpreted the cardiac monitor which showed an underlying rhythm of:  {cardiac monitor:26849}  Complexity of problems addressed: Patient's presentation is most consistent with  {COPA:26843} During patient's assessment  Disposition: After consideration of the diagnostic results and the patient's response to treatment,  I feel that the patent would benefit from {disposition:26850}.    {Document critical care time when appropriate:1} {Document review of labs and clinical decision tools ie heart score, Chads2Vasc2 etc:1}  {Document your independent review of radiology images, and any outside records:1} {Document your discussion with family members, caretakers, and with consultants:1} {Document social determinants of health affecting pt's care:1} {Document your decision making why or why not admission, treatments were needed:1} Final Clinical Impression(s) / ED Diagnoses Final diagnoses:  None    Rx / DC Orders ED Discharge Orders     None

## 2023-11-06 NOTE — ED Triage Notes (Signed)
Pt BIB [facility] from home C/O sudden onset of dizziness, SOB with exertion today. Pt found to be in Afib with rate of 120-170. Given 500 mL NS and 4 mg Zofran en route. Of note, pt has had n/v X 3 days. Denies diarrhea.

## 2024-03-24 ENCOUNTER — Other Ambulatory Visit: Payer: Self-pay | Admitting: Family Medicine

## 2024-03-24 DIAGNOSIS — Z1231 Encounter for screening mammogram for malignant neoplasm of breast: Secondary | ICD-10-CM

## 2024-05-09 ENCOUNTER — Ambulatory Visit
Admission: RE | Admit: 2024-05-09 | Discharge: 2024-05-09 | Disposition: A | Discharge status: Home / Self Care | Source: Ambulatory Visit | Attending: Family Medicine | Admitting: Family Medicine

## 2024-05-09 DIAGNOSIS — Z1231 Encounter for screening mammogram for malignant neoplasm of breast: Secondary | ICD-10-CM | POA: Diagnosis present
